# Patient Record
Sex: Female | Born: 1990 | Race: White | Hispanic: No | Marital: Single | Smoking: Former smoker
Health system: Southern US, Community
[De-identification: ages and names within clinical notes are randomized; demographics above are authoritative.]

## PROBLEM LIST (undated history)

## (undated) DIAGNOSIS — J45909 Unspecified asthma, uncomplicated: Secondary | ICD-10-CM

## (undated) DIAGNOSIS — N809 Endometriosis, unspecified: Secondary | ICD-10-CM

## (undated) DIAGNOSIS — A0472 Enterocolitis due to Clostridium difficile, not specified as recurrent: Secondary | ICD-10-CM

## (undated) HISTORY — DX: Endometriosis, unspecified: N80.9

## (undated) HISTORY — DX: Enterocolitis due to Clostridium difficile, not specified as recurrent: A04.72

## (undated) HISTORY — PX: COLPOSCOPY W/ BIOPSY / CURETTAGE: SUR283

## (undated) HISTORY — DX: Unspecified asthma, uncomplicated: J45.909

---
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
## 2012-03-30 HISTORY — PX: PELVIC LAPAROSCOPY: SHX162

## 2012-04-10 ENCOUNTER — Emergency Department: Payer: Self-pay | Admitting: Emergency Medicine

## 2012-04-10 LAB — COMPREHENSIVE METABOLIC PANEL
Anion Gap: 8 (ref 7–16)
BUN: 9 mg/dL (ref 7–18)
Chloride: 106 mmol/L (ref 98–107)
Co2: 24 mmol/L (ref 21–32)
Creatinine: 0.72 mg/dL (ref 0.60–1.30)
EGFR (Non-African Amer.): >60
Osmolality: 275 (ref 275–301)
SGPT (ALT): 18 U/L (ref 12–78)
Sodium: 138 mmol/L (ref 136–145)

## 2012-04-10 LAB — CBC
HCT: 39.6 % (ref 35.0–47.0)
MCHC: 33 g/dL (ref 32.0–36.0)
MCV: 89 fL (ref 80–100)
Platelet: 354 10*3/uL (ref 150–440)

## 2012-04-10 LAB — URINALYSIS, COMPLETE
Specific Gravity: 1.017 (ref 1.003–1.030)
Squamous Epithelial: 2

## 2012-04-12 ENCOUNTER — Other Ambulatory Visit: Payer: Self-pay | Admitting: Internal Medicine

## 2012-04-12 DIAGNOSIS — R1031 Right lower quadrant pain: Secondary | ICD-10-CM

## 2012-04-15 ENCOUNTER — Other Ambulatory Visit: Payer: Self-pay

## 2012-05-30 ENCOUNTER — Ambulatory Visit: Payer: Self-pay | Admitting: Obstetrics and Gynecology

## 2012-06-10 ENCOUNTER — Ambulatory Visit: Payer: Self-pay | Admitting: Obstetrics and Gynecology

## 2012-09-12 ENCOUNTER — Ambulatory Visit: Payer: Self-pay | Admitting: Internal Medicine

## 2012-09-21 ENCOUNTER — Encounter (INDEPENDENT_AMBULATORY_CARE_PROVIDER_SITE_OTHER): Payer: Self-pay | Admitting: Surgery

## 2012-09-28 ENCOUNTER — Encounter (INDEPENDENT_AMBULATORY_CARE_PROVIDER_SITE_OTHER): Payer: Self-pay | Admitting: Surgery

## 2012-09-28 ENCOUNTER — Other Ambulatory Visit (INDEPENDENT_AMBULATORY_CARE_PROVIDER_SITE_OTHER): Payer: Self-pay | Admitting: Surgery

## 2012-09-28 ENCOUNTER — Ambulatory Visit (INDEPENDENT_AMBULATORY_CARE_PROVIDER_SITE_OTHER): Payer: BC Managed Care – PPO | Admitting: Surgery

## 2012-09-28 VITALS — BP 112/58 | HR 80 | Resp 14 | Ht 62.0 in | Wt 104.2 lb

## 2012-09-28 DIAGNOSIS — N644 Mastodynia: Secondary | ICD-10-CM

## 2012-09-28 DIAGNOSIS — N6019 Diffuse cystic mastopathy of unspecified breast: Secondary | ICD-10-CM | POA: Insufficient documentation

## 2012-09-28 DIAGNOSIS — N6012 Diffuse cystic mastopathy of left breast: Secondary | ICD-10-CM

## 2012-09-28 NOTE — Progress Notes (Signed)
General Surgery [facility]  Chief Complaint  Patient presents with  . 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 Patient states that she has had pain in the left breast for at least one year. It has become more significant in the past several months. She describes pain in the upper outer quadrant radiating under the left axilla. Patient was seen and student health and referred to [facility] where she underwent a left breast ultrasound and bilateral mammogram. Mammogram showed no evidence of malignancy but due to very dense glandular tissue, the sensitivity is limited. 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 Patient is now referred to surgery for further recommendations for management.  Patient is currently taking birth control pills under the direction of her gynecologist. She has had no prior breast surgery.  There is a history of breast cancer in a maternal great aunt. There is no history of breast disease in the immediate family members.  Past Medical History  Diagnosis Date  . Asthma   . Endometriosis      Current Outpatient Prescriptions  Medication Sig Dispense Refill  . CALCIUM-VITAMIN D PO Take by mouth.      . fexofenadine (ALLEGRA) 60 MG tablet Take 60 mg by mouth daily.      . norethindrone-ethinyl estradiol (MICROGESTIN,JUNEL,LOESTRIN) 1-20 MG-MCG tablet Take 1 tablet by mouth daily.      . Probiotic Product (SOLUBLE FIBER/PROBIOTICS PO) Take by mouth.      . meloxicam (MOBIC) 7.5 MG tablet Take 7.5 mg by mouth daily.       No current facility-administered medications for this visit.     No Known Allergies   Family History  Problem Relation Age of Onset  . Cancer  Mother     skin  . Cancer Maternal Aunt     breast     History   Social History  . Marital Status: Single    Spouse Name: N/A    Number of Children: N/A  . Years of Education: N/A   Social History Main Topics  . Smoking status: Light Tobacco Smoker  . Smokeless tobacco: None  . Alcohol Use: Yes  . Drug Use: No  . Sexually Active: None   Other Topics Concern  . None   Social History Narrative  . None     REVIEW OF SYSTEMS - PERTINENT POSITIVES ONLY: Denies nipple discharge. Pain does not appear to cycle with menstrual cycles.  EXAM: Filed Vitals:   09/28/12 0952  BP: 112/58  Pulse: 80  Resp: 14    HEENT: normocephalic; pupils equal and reactive; sclerae clear; dentition good; mucous membranes moist NECK:  symmetric on extension; no palpable anterior or posterior cervical lymphadenopathy; no supraclavicular masses; no tenderness CHEST: clear to auscultation bilaterally without rales, rhonchi, or wheezes CARDIAC: regular rate and rhythm without significant murmur; peripheral pulses are full BREAST: Right breast shows normal nipple areolar complex; breast parenchyma is quite dense; there is increased density and nodularity in the upper outer quadrant without discrete or dominant mass; there are small soft left nodes palpable in the axilla; left breast shows a normal nipple areolar complex; again there is diffuse nodularity in the breast, increased in the upper-outer quadrant, with mild tenderness; there are soft small  lymph nodes present in the left axilla with mild tenderness EXT:  non-tender without edema; no deformity NEURO: no gross focal deficits; no sign of tremor   LABORATORY RESULTS: See [facility] (CHL-Epic) for most recent results   RADIOLOGY RESULTS: See [facility] (CHL-Epic) for most recent results   IMPRESSION: #1 likely fibrocystic change of the breast, symptomatic #2 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 I provided her with written literature to review. We discussed fibrocystic change and its significance. I explained to her that she was not at increased risk for breast malignancy based on her family history or on her condition of fibrocystic change.  We discussed measures to take to decrease her discomfort. This includes avoiding caffeine, chocolate, and fatty foods. I suggested possibly taking the vitamin E supplement or going to a natural food store for rose hips as a supplement.  She may also want to discuss changing her birth control medication with her gynecologist.  As a precaution, I am going to ask her to return to [facility] for a follow-up ultrasound of the left breast in 4 months. I will see her back in our office following that study for repeat physical examination and further discussion.  Velora Heckler, MD, FACS General & Endocrine Surgery [facility]   Visit Diagnoses: 1. Mastodynia   2. Fibrocystic change of breast, unspecified laterality     Primary Care Physician: No primary provider on file.

## 2012-09-28 NOTE — Patient Instructions (Signed)
Fibrocystic Breast Changes  Fibrocystic breast changes is a non-cancerous(benign) condition that about half of all women have at some time in their life. It is also called benign breast disease and mammary dysplasia. It may also be called fibrocystic breast disease, but it is not really a disease. It is a common condition that occurs when women go through the hormonal changes during their menstrual cycle, between the ages of 20 to 50. Menopausal women do not have this problem, unless they are on hormone therapy. It can affect one or both breasts. This is not a sign that you will later get cancer.  CAUSES   Overgrowth of cells lining the milk ducts, or enlarged lobules in the breast, cause the breast duct to become blocked. The duct then fills up with fluid. This is like a small balloon filled with water. It is called a cyst. Over time, with repeated inflammation there is a tendency to form scar tissue. This scar tissue becomes the fibrous part of fibrocystic disease. The exact cause of this happening is not known, but it may be related to the female hormones, estrogen and progesterone. Heredity (genetics) may also be a factor in some cases.  SYMPTOMS    Tenderness.   Swelling.   Rope-like feeling.   Lumpy breast, one or both sides.   Changes in the size of the breasts, before and after the menstrual period (larger before, smaller after).   Green or dark brown nipple discharge (not blood).  Symptoms are usually worse before periods (menstrual cycle) and get better toward the end of menstruation. Usually, it is temporary minor discomfort. But some women have severe pain.   DIAGNOSIS   Check your breasts monthly. The best time to check your breasts is after your period. If you check them during your period, you are more likely to feel the normal glands enlarged, as a result of the hormonal changes that happen right before your period. If you do not have menstrual periods, check your breasts the first day of every  month. Become familiar with the way your own breasts feel. It is then easier to notice if there are changes, such as more tenderness, a new growth, change in breast size, or a change in a lump that has always been there. All breasts lumps need to be investigated, to rule out breast cancer. See your caregiver as soon as possible, if you find a lump. Most breast lumps are not cancerous. Excellent treatment is available for ones that are.   To make a diagnosis, your caregiver will examine your breasts and may recommend other tests, such as:   Mammogram (breast X-ray).   Ultrasound.   MRI (magnetic resonance imaging).   Removing fluid from the cyst with a fine needle, under local anesthesia (aspiration).   Taking a breast tissue sample (breast biopsy).  Some questions your caregiver will ask are:   What was the date of your last period?   When did the lump show up?   Is there any discharge from your breast?   Is the breast tender or painful?   Are the symptoms in one or both breasts?   Has the lump changed in size from month-to-month? How long has it been present?   Any family history of breast problems?   Any past breast problems?   Any history of breast surgery?   Are you taking any medications?   When was your last mammogram, and where was it done?  TREATMENT      Dietary changes help to prevent or reduce the symptoms of fibrocystic breast changes.   You may need to stop consuming all foods that contain caffeine, such as chocolate, sodas, coffee, and tea.   Reducing sugar and fat in your diet may also help.   Decrease estrogen in your diet. Some sources include commercially raised meats which contain estrogen. Eliminate other natural estrogens.   Birth control pills can also make symptoms worse.   Natural progesterone cream, applied at a dose of 15 to 20 milligrams per day, from ovulation until a day or two before your period returns, may help with returning to normal breast tissue over several  months. Seek advice from your caregiver.   Over-the-counter pain pills may help, as recommended by your caregiver.   Danazol hormone (female-like hormone) is sometimes used. It may cause hair growth and acne.   Needle aspiration can be used, to remove fluid from the cyst.   Surgery may be needed, to remove a large, persistent, and tender cyst.   Evening primrose oil may help with the tenderness and pain. It has linolenic acid that women may not have enough of.  HOME CARE INSTRUCTIONS    Examine your breasts after every menstrual period.   If you do not have menstrual periods, examine your breasts the first day of every month.   Wear a firm support bra, especially when exercising.   Decrease or avoid caffeine in your diet.   Decrease the fat and sugar in your diet.   Eat a balanced diet.   Try to see your caregiver after you have a menstrual period.   Before seeing your caregiver, make notes about:   When you have the symptoms.   What types of symptoms you are having.   Medications you are taking.   When and where your last mammogram was taken.   Past breast problems or breast surgery.  SEEK MEDICAL CARE IF:    You have been diagnosed with fibrocystic breast changes, and you develop changes in your breast:   Discharge from the nipple, especially bloody discharge.   Pain in the breast that does not go away after your menstrual period.   New lumps or bumps in the breast.   Lumps in your armpit.   Your breast or breasts become enlarged, red, and painful.   You find an isolated lump, even if it is not tender.   You have questions about this condition that have not been answered.  Document Released: 12/31/2005 Document Revised: 06/08/2011 Document Reviewed: 03/27/2009  [employer] Patient Information 2014 [employer].

## 2012-10-04 ENCOUNTER — Encounter: Payer: Self-pay | Admitting: General Surgery

## 2012-10-04 ENCOUNTER — Ambulatory Visit (INDEPENDENT_AMBULATORY_CARE_PROVIDER_SITE_OTHER): Payer: BC Managed Care – PPO | Admitting: General Surgery

## 2012-10-04 ENCOUNTER — Other Ambulatory Visit: Payer: Self-pay

## 2012-10-04 VITALS — BP 120/64 | HR 68 | Resp 12 | Ht 62.0 in | Wt 103.0 lb

## 2012-10-04 DIAGNOSIS — N644 Mastodynia: Secondary | ICD-10-CM

## 2012-10-04 DIAGNOSIS — N6009 Solitary cyst of unspecified breast: Secondary | ICD-10-CM | POA: Insufficient documentation

## 2012-10-04 DIAGNOSIS — N6002 Solitary cyst of left breast: Secondary | ICD-10-CM

## 2012-10-04 NOTE — Patient Instructions (Signed)
The patient is aware to use an antiinflammatory of choice (Advil or Aleve) as needed for comfort.

## 2012-10-04 NOTE — Progress Notes (Signed)
Patient ID: Brenda Knight, female   DOB: March 31, 1990, 22 y.o.   MRN: [id number]  Chief Complaint  Patient presents with  . Breast Problem    2nd opinion left breast mass    HPI Brenda Knight is a 22 y.o. female here today for an left breast mass . Had an ultrasound at [facility] on 09/22/12. Patient noticed it about three months ago, she states she has had pain in the area  for about two years. The patient is accompanied today by her mother who was present for the interview and exam. The patient has experienced discomfort in the upper outer quadrant of the left breast for about 2 years, worse over the last 6-12 months. She is appreciating daily discomfort, not aggravated by menses or physical activity.  She was evaluated in [place] earlier this year and reassured that no pathologic process was present. As she's had persistent pain she is seen today for a second opinion.  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 HPI  Past Medical History  Diagnosis Date  . Asthma   . Endometriosis   . C. difficile colitis 2008    Past Surgical History  Procedure Laterality Date  . Pelvic laparoscopy  2014    endometriosis    Family History  Problem Relation Age of Onset  . Cancer Mother     skin  . Cancer Maternal Aunt     breast    Social History History  Substance Use Topics  . Smoking status: Former Smoker -- 1 years  . Smokeless tobacco: Not on file  . Alcohol Use: Yes    Allergies  Allergen Reactions  . Sulfa Antibiotics Shortness Of Breath and Other (See Comments)    "Skin turns red" per patient    Current Outpatient Prescriptions  Medication Sig Dispense Refill  . CALCIUM-VITAMIN D PO Take by mouth.      . fexofenadine (ALLEGRA) 60 MG tablet Take 60 mg by mouth daily.      Colleen Can FE 1/20 1-20 MG-MCG tablet Take 1 tablet by mouth daily.      . norethindrone-ethinyl estradiol (MICROGESTIN,JUNEL,LOESTRIN) 1-20 MG-MCG tablet Take 1 tablet by mouth daily.       . Probiotic Product (SOLUBLE FIBER/PROBIOTICS PO) Take by mouth.       No current facility-administered medications for this visit.    Review of Systems Review of Systems  Constitutional: Negative.   Respiratory: Negative.   Cardiovascular: Negative.     Blood pressure 120/64, pulse 68, resp. rate 12, height 5\' 2"  (1.575 m), weight 103 lb (46.72 kg).  Physical Exam Physical Exam  Constitutional: She is oriented to person, place, and time. She appears well-developed and well-nourished.  Cardiovascular: Normal rate, regular rhythm and normal heart sounds.   Pulmonary/Chest: Breath sounds normal. Right breast exhibits no inverted nipple, no mass, no nipple discharge, no skin change and no tenderness. Left breast exhibits no inverted nipple, no mass, no nipple discharge, no skin change and no tenderness.  Lymphadenopathy:    She has no cervical adenopathy.  Tiny right axillary lymph node   Neurological: She is alert and oriented to person, place, and time.  Skin: Skin is warm and dry.    Data Reviewed Bilateral mammogram dated September 12, 2012 she extremely dense breasts. No discernible lesion is identified. BI-RAD-2.  Focused ultrasound examination of the left breast on the same date showed a mildly prominent lymph node of the left axilla region of the site  of the patient's pain. Elliptical low echotexture solid-appearing smoothly marginated nodules noted at 12 and 1:30 o'clock position. The axillary lymph node measured at 1.44 cm in size.  Ultrasound examination of the area of focal tenderness near the periphery of the breast at the 1:00 position in the axillary tail showed a 1 cm trauma and lymph node corresponding area of palpable tenderness. At the 12:00 position 6 cm in the nipple a 0.5 x 0.6 cm simple cyst is identified. A smaller lesion is noted in the peri-areolar area.  Assessment    llong-standing mastalgia, centered on a mildly prominent left axillary node.      Plan     Options for management were reviewed with the patient and her mother. Office notes from Dr. Cathrine Muster dated September 28, 2012 werereviewed.  The area of maximum tenderness is overlying the axillary lymph node. Resection can be completed to #1) confirm the clinical diagnosis of a hyperplastic lymph node and remove any concern for breast cancer/lymphoma which is a high level of concern for the mother and #2) possibly relieve the focal pain the patient is experiencing. It was emphasized to both patient and mother that excision may not relieve the breast pain she is experiencing.    The risks associated with a vacuum biopsy of the area including bleeding, infection and persistent pain were discussed.        Earline Mayotte 10/04/2012, 8:17 PM

## 2012-10-10 ENCOUNTER — Encounter: Payer: Self-pay | Admitting: General Surgery

## 2012-10-10 ENCOUNTER — Ambulatory Visit (INDEPENDENT_AMBULATORY_CARE_PROVIDER_SITE_OTHER): Payer: BC Managed Care – PPO | Admitting: General Surgery

## 2012-10-10 VITALS — BP 108/64 | HR 80 | Resp 12 | Ht 62.0 in | Wt 107.0 lb

## 2012-10-10 DIAGNOSIS — N6012 Diffuse cystic mastopathy of left breast: Secondary | ICD-10-CM

## 2012-10-10 DIAGNOSIS — N63 Unspecified lump in unspecified breast: Secondary | ICD-10-CM

## 2012-10-10 DIAGNOSIS — N644 Mastodynia: Secondary | ICD-10-CM

## 2012-10-10 DIAGNOSIS — N6019 Diffuse cystic mastopathy of unspecified breast: Secondary | ICD-10-CM

## 2012-10-10 NOTE — Patient Instructions (Signed)

## 2012-10-10 NOTE — Progress Notes (Signed)
aPatient ID: Brenda Knight, female   DOB: February 05, 1991, 22 y.o.   MRN: [id number]  Chief Complaint  Patient presents with  . Procedure    left axillary biopsy    HPI Brenda Knight is a 22 y.o. female here today for an left axillary biopsy. T. Health records from [facility] Were reviewed, and there had been a recommendation for a colposcopy based on an abnormal Pap smear. The patient is presently under the care of Vena Austria, M.D. At [facility] in this regard. HPI  Past Medical History  Diagnosis Date  . Asthma   . Endometriosis   . C. difficile colitis 2008    Past Surgical History  Procedure Laterality Date  . Pelvic laparoscopy  2014    endometriosis    Family History  Problem Relation Age of Onset  . Cancer Mother     skin  . Cancer Maternal Aunt     breast    Social History History  Substance Use Topics  . Smoking status: Former Smoker -- 1 years  . Smokeless tobacco: Not on file  . Alcohol Use: Yes    Allergies  Allergen Reactions  . Sulfa Antibiotics Shortness Of Breath and Other (See Comments)    "Skin turns red" per patient    Current Outpatient Prescriptions  Medication Sig Dispense Refill  . CALCIUM-VITAMIN D PO Take by mouth.      . fexofenadine (ALLEGRA) 60 MG tablet Take 60 mg by mouth daily.      Colleen Can FE 1/20 1-20 MG-MCG tablet Take 1 tablet by mouth daily.      . norethindrone-ethinyl estradiol (MICROGESTIN,JUNEL,LOESTRIN) 1-20 MG-MCG tablet Take 1 tablet by mouth daily.      . Probiotic Product (SOLUBLE FIBER/PROBIOTICS PO) Take by mouth.       No current facility-administered medications for this visit.    Review of Systems Review of Systems  Constitutional: Negative.   Respiratory: Negative.   Cardiovascular: Negative.     Blood pressure 108/64, pulse 80, resp. rate 12, height 5\' 2"  (1.575 m), weight 107 lb (48.535 kg).  Physical Exam Physical Exam Mild prominence in the axillary tail/lower axilla and the left is again  appreciated. Data Reviewed Previous ultrasound reviewed. Ultrasound examination showed a 0.5 x 0.55 x 0.9 cm hypoechoic area with some focal shadowing near the tail of the axilla. Assessment    Prominent axillary tail/possible lymphadenopathy left axilla.     Plan    The patient was amenable to proceed to biopsy. She was accompanied today by her mother who was present for the procedure.  The area was prepped with all of 10 cc of 0.5% Xylocaine with 0.25% Marcaine with one 200,000 units of epinephrine was utilized a well-tolerated. The area was then prepped with ChloraPrep draped. A 10-gauge Encor device was advanced under ultrasound guidance. A core samples were obtained. Mild discomfort in the most superficial samples was noted. This had resolved completely at the end of the procedure. Scant bleeding. Wound defect was closed with benzoin and Steri-Strips followed by Telfa and Tegaderm dressing. Written instructions were provided. The patient will return for nursing check in one week. She'll contact when results are available.        Earline Mayotte 10/11/2012, 9:21 PM

## 2012-10-11 ENCOUNTER — Telehealth: Payer: Self-pay

## 2012-10-11 ENCOUNTER — Encounter: Payer: Self-pay | Admitting: General Surgery

## 2012-10-11 NOTE — Telephone Encounter (Signed)
Message copied by Sinda Du on Tue Oct 11, 2012 12:29 PM ------      Message from: [place], Merrily Pew      Created: Sun Oct 09, 2012 10:13 AM       Contact patient and see if she had a colposcopy in fall 2013 or this year to follow up an abnormal pap smear. Thanks.  ------

## 2012-10-11 NOTE — Telephone Encounter (Signed)
Patient called back to report that she has not had a colposcopy to follow up from her abnormal pap. This patient states that Dr. Bonney Aid at [facility] did not make mention that she needed to have this done.

## 2012-10-11 NOTE — Telephone Encounter (Signed)
Message left on mobile voicemail to call back.

## 2012-10-12 ENCOUNTER — Telehealth: Payer: Self-pay

## 2012-10-12 HISTORY — PX: BREAST BIOPSY: SHX20

## 2012-10-12 LAB — PATHOLOGY

## 2012-10-12 NOTE — Telephone Encounter (Signed)
Spoke with patient and let her know that her pathology was benign. Patient is very pleased and will follow up with the nurse here as scheduled.

## 2012-10-18 ENCOUNTER — Encounter (INDEPENDENT_AMBULATORY_CARE_PROVIDER_SITE_OTHER): Payer: Self-pay

## 2012-10-18 ENCOUNTER — Ambulatory Visit: Payer: BC Managed Care – PPO

## 2012-10-21 ENCOUNTER — Encounter: Payer: Self-pay | Admitting: General Surgery

## 2012-11-09 ENCOUNTER — Ambulatory Visit: Payer: BC Managed Care – PPO | Admitting: General Surgery

## 2012-11-30 ENCOUNTER — Ambulatory Visit: Payer: BC Managed Care – PPO | Admitting: General Surgery

## 2012-12-13 ENCOUNTER — Encounter: Payer: Self-pay | Admitting: General Surgery

## 2012-12-13 ENCOUNTER — Ambulatory Visit (INDEPENDENT_AMBULATORY_CARE_PROVIDER_SITE_OTHER): Payer: BC Managed Care – PPO | Admitting: General Surgery

## 2012-12-13 VITALS — BP 94/58 | HR 84 | Resp 12 | Ht 62.0 in | Wt 101.0 lb

## 2012-12-13 DIAGNOSIS — N63 Unspecified lump in unspecified breast: Secondary | ICD-10-CM

## 2012-12-13 NOTE — Patient Instructions (Addendum)
Continue self breast exams. Call office for any new breast issues or concerns. 

## 2012-12-13 NOTE — Progress Notes (Signed)
Patient ID: Brenda Knight, female   DOB: [birth year], 22 y.o.   MRN: [id number]  Chief Complaint  Patient presents with  . Follow-up    HPI Brenda Knight is a 22 y.o. female.  Here today for her follow up from a left breast biopsy done 10-12-12.  No new breast issues. The patient reports that the tenderness she previously experienced in the axillary tail of the left breast has resolved postbiopsy.  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 HPI  Past Medical History  Diagnosis Date  . Asthma   . Endometriosis   . C. difficile colitis 2008    Past Surgical History  Procedure Laterality Date  . Pelvic laparoscopy  2014    endometriosis  . Breast biopsy Left 10-12-12    FIBROADENOMA    Family History  Problem Relation Age of Onset  . Cancer Mother     skin  . Cancer Maternal Aunt     breast    Social History History  Substance Use Topics  . Smoking status: Former Smoker -- 1 years  . Smokeless tobacco: Not on file  . Alcohol Use: Yes    Allergies  Allergen Reactions  . Sulfa Antibiotics Shortness Of Breath and Other (See Comments)    "Skin turns red" per patient    Current Outpatient Prescriptions  Medication Sig Dispense Refill  . CALCIUM-VITAMIN D PO Take by mouth.      . fexofenadine (ALLEGRA) 60 MG tablet Take 60 mg by mouth daily.      Colleen Can FE 1/20 1-20 MG-MCG tablet Take 1 tablet by mouth daily.      . norethindrone-ethinyl estradiol (MICROGESTIN,JUNEL,LOESTRIN) 1-20 MG-MCG tablet Take 1 tablet by mouth daily.      . Probiotic Product (SOLUBLE FIBER/PROBIOTICS PO) Take by mouth.       No current facility-administered medications for this visit.    Review of Systems Review of Systems  Constitutional: Negative.   Respiratory: Negative.   Cardiovascular: Negative.     Blood pressure 94/58, pulse 84, resp. rate 12, height 5\' 2"  (1.575 m), weight 101 lb (45.813 kg).  Physical Exam Physical Exam  Constitutional: She is oriented to  person, place, and time. She appears well-developed and well-nourished.  Pulmonary/Chest: Right breast exhibits no inverted nipple, no mass, no nipple discharge, no skin change and no tenderness. Left breast exhibits no inverted nipple, no nipple discharge, no skin change and no tenderness. Mass: 1 cm nodule l focal thickening in the axillary tail of the left breast. Nontender. Area of previous biopsy.  Well healed scar left breast  Lymphadenopathy:    She has no axillary adenopathy.  Neurological: She is alert and oriented to person, place, and time.  Skin: Skin is warm and dry.    Data Reviewed Biopsy showed evidence of a fibroadenoma.  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    Plan    Monthly breast self-examination has been encouraged. Should the patient develop recurrent discomfort were appreciated enlargement she has been encouraged to call and we'll discuss excision.       Earline Mayotte 12/13/2012, 9:25 PM

## 2013-01-13 ENCOUNTER — Encounter (INDEPENDENT_AMBULATORY_CARE_PROVIDER_SITE_OTHER): Payer: Self-pay | Admitting: Surgery

## 2013-02-02 ENCOUNTER — Other Ambulatory Visit: Payer: Self-pay

## 2013-10-17 ENCOUNTER — Ambulatory Visit: Payer: Self-pay | Admitting: General Surgery

## 2013-11-21 ENCOUNTER — Encounter: Payer: Self-pay | Admitting: *Deleted

## 2014-01-29 ENCOUNTER — Encounter: Payer: Self-pay | Admitting: General Surgery

## 2014-02-27 ENCOUNTER — Emergency Department (HOSPITAL_COMMUNITY)
Admission: EM | Admit: 2014-02-27 | Discharge: 2014-02-27 | Disposition: A | Discharge status: Home / Self Care | Payer: Self-pay | Attending: Emergency Medicine | Admitting: Emergency Medicine

## 2014-02-27 ENCOUNTER — Encounter (HOSPITAL_COMMUNITY): Payer: Self-pay

## 2014-02-27 DIAGNOSIS — Y92009 Unspecified place in unspecified non-institutional (private) residence as the place of occurrence of the external cause: Secondary | ICD-10-CM | POA: Insufficient documentation

## 2014-02-27 DIAGNOSIS — Z79899 Other long term (current) drug therapy: Secondary | ICD-10-CM | POA: Insufficient documentation

## 2014-02-27 DIAGNOSIS — Y9389 Activity, other specified: Secondary | ICD-10-CM | POA: Insufficient documentation

## 2014-02-27 DIAGNOSIS — Z8742 Personal history of other diseases of the female genital tract: Secondary | ICD-10-CM | POA: Insufficient documentation

## 2014-02-27 DIAGNOSIS — Z8619 Personal history of other infectious and parasitic diseases: Secondary | ICD-10-CM | POA: Insufficient documentation

## 2014-02-27 DIAGNOSIS — Z720 Tobacco use: Secondary | ICD-10-CM | POA: Insufficient documentation

## 2014-02-27 DIAGNOSIS — Y288XXA Contact with other sharp object, undetermined intent, initial encounter: Secondary | ICD-10-CM | POA: Insufficient documentation

## 2014-02-27 DIAGNOSIS — J45909 Unspecified asthma, uncomplicated: Secondary | ICD-10-CM | POA: Insufficient documentation

## 2014-02-27 DIAGNOSIS — Y998 Other external cause status: Secondary | ICD-10-CM | POA: Insufficient documentation

## 2014-02-27 DIAGNOSIS — S61412A Laceration without foreign body of left hand, initial encounter: Secondary | ICD-10-CM | POA: Insufficient documentation

## 2014-02-27 MED ORDER — IBUPROFEN 600 MG PO TABS: 600.0000 mg | ORAL_TABLET | Freq: Four times a day (QID) | ORAL | Status: DC | PRN

## 2014-02-27 MED ORDER — BACITRACIN 500 UNIT/GM EX OINT
1.0000 "application " | TOPICAL_OINTMENT | Freq: Two times a day (BID) | CUTANEOUS | Status: DC
  Administered 2014-02-27: 1 via TOPICAL

## 2014-02-27 NOTE — Discharge Instructions (Signed)
Please follow up with your primary care physician in 1-2 days. If you do not have one please call the [facility] number listed above. Please alternate between Motrin and Tylenol every three hours for pain. Please keep the area clean dry and covered. Please read all discharge instructions and return precautions.   Non-Sutured Laceration A laceration is a cut or wound that goes through all layers of the skin and into the tissue just beneath the skin. Usually, these are stitched up or held together with tape or glue shortly after the injury occurred. However, if several or more hours have passed before getting care, too many germs (bacteria) get into the laceration. Stitching it closed would bring the risk of infection. If your health care provider feels your laceration is too old, it may be left open and then bandaged to allow healing from the bottom layer up. HOME CARE INSTRUCTIONS   Change the bandage (dressing) 2 times a day or as directed by your health care provider.  If the dressing or packing gauze sticks, soak it off with soapy water.  When you re-bandage your laceration, make sure that the dressing or packing gauze goes all the way to the bottom of the laceration. The top of the laceration is kept open so it can heal from the bottom up. There is less chance for infection with this method.  Wash the area with soap and water 2 times a day to remove all the creams or ointments, if used. Rinse off the soap. Pat the area dry with a clean towel. Look for signs of infection, such as redness, swelling, or a red line that goes away from the laceration.  Re-apply creams or ointments if they were used to bandage the laceration. This helps keep the bandage from sticking.  If the bandage becomes wet, dirty, or has a bad smell, change it as soon as possible.  Only take medicine as directed by your health care provider. You might need a tetanus shot now if:  You have no idea when  you had the last one.  You have never had a tetanus shot before.  Your laceration had dirt in it.  Your laceration was dirty, and your last tetanus shot was more than 7 years ago.  Your laceration was clean, and your last tetanus shot was more than 10 years ago. If you need a tetanus shot, and you decide not to get one, there is a rare chance of getting tetanus. Sickness from tetanus can be serious. If you got a tetanus shot, your arm may swell and get red and warm to the touch at the shot site. This is common and not a problem. SEEK MEDICAL CARE IF:   You have redness, swelling, or increasing pain in the laceration.  You notice a red line that goes away from your laceration.  You have pus coming from the laceration.  You have a fever.  You notice a bad smell coming from the laceration or dressing.  You notice something coming out of the laceration, such as wood or glass.  Your laceration is on your hand or foot and you are unable to properly move a finger or toe.  You have severe swelling around the laceration, causing pain and numbness.  You notice a change in color in your arm, hand, leg, or foot. MAKE SURE YOU:   Understand these instructions.  Will watch your condition.  Will get help right away if you are not doing  well or get worse. Document Released: 02/11/2006 Document Revised: 03/21/2013 Document Reviewed: 09/03/2008 [facility] Patient Information 2015 [place]. This information is not intended to replace advice given to you by your health care provider. Make sure you discuss any questions you have with your health care provider.

## 2014-02-27 NOTE — ED Notes (Signed)
Pt from home with laceration to left hand; pt cut hand with glass jar. Pt sts injury occurred about 6 pm last night.  Sts she put liquid stitches on it.  Last tetanus 2009.

## 2014-02-27 NOTE — ED Provider Notes (Signed)
CSN: [id number]     Arrival date & time 02/27/14  1837 History  This chart was scribed for non-physician practitioner, Francee PiccoloJennifer Franny Selvage, PA-C working with Gerhard Munchobert Lockwood, MD by Greggory StallionKayla Andersen, ED scribe. This patient was seen in room TR04C/TR04C and the patient's care was started at 7:39 PM.   Chief Complaint  Patient presents with  . Hand Injury   The history is provided by the patient. No language interpreter was used.    HPI Comments: Brenda Knight is a 23 y.o. female who presents to the Emergency Department complaining of a laceration to her left hand that occurred at 6 PM yesterday. Pt accidentally cut her hand on a glass jar. States she put liquid sutures on the wound. Reports pain around the area. She has taken ibuprofen with some relief. Pt's last tetanus was in 2009. She is right hand dominant. Denies previous hand injuries.  Past Medical History  Diagnosis Date  . Asthma   . Endometriosis   . C. difficile colitis 2008   Past Surgical History  Procedure Laterality Date  . Pelvic laparoscopy  2014    endometriosis  . Breast biopsy Left 10-12-12    FIBROADENOMA   Family History  Problem Relation Age of Onset  . Cancer Mother     skin  . Cancer Maternal Aunt     breast   History  Substance Use Topics  . Smoking status: Current Some Day Smoker -- 1 years  . Smokeless tobacco: Not on file  . Alcohol Use: Yes   OB History    Obstetric Comments   1st Menstrual Cycle: 13 1st Pregnancy: NA     Review of Systems  Musculoskeletal: Positive for arthralgias.  Skin: Positive for wound.  All other systems reviewed and are negative.  Allergies  Sulfa antibiotics  Home Medications   Prior to Admission medications   Medication Sig Start Date End Date Taking? Authorizing Provider  CALCIUM-VITAMIN D PO Take by mouth.    Historical Provider, MD  fexofenadine (ALLEGRA) 60 MG tablet Take 60 mg by mouth daily.    Historical Provider, MD  ibuprofen (ADVIL,MOTRIN) 600 MG  tablet Take 1 tablet (600 mg total) by mouth every 6 (six) hours as needed. 02/27/14   Jermiyah Ricotta L Alysia Scism, PA-C  JUNEL FE 1/20 1-20 MG-MCG tablet Take 1 tablet by mouth daily. 09/23/12   Historical Provider, MD  norethindrone-ethinyl estradiol (MICROGESTIN,JUNEL,LOESTRIN) 1-20 MG-MCG tablet Take 1 tablet by mouth daily.    Historical Provider, MD  Probiotic Product (SOLUBLE FIBER/PROBIOTICS PO) Take by mouth.    Historical Provider, MD   BP 116/65 mmHg  Pulse 78  Temp(Src) 96.9 F (36.1 C) (Oral)  Resp 16  Ht 5\' 2"  (1.575 m)  Wt 96 lb (43.545 kg)  BMI 17.55 kg/m2  SpO2 100%  LMP 02/19/2014   Physical Exam  Constitutional: She is oriented to person, place, and time. She appears well-developed and well-nourished. No distress.  HENT:  Head: Normocephalic and atraumatic.  Right Ear: External ear normal.  Left Ear: External ear normal.  Nose: Nose normal.  Mouth/Throat: Oropharynx is clear and moist.  Eyes: Conjunctivae are normal.  Neck: Normal range of motion. Neck supple.  Cardiovascular: Normal rate, regular rhythm, normal heart sounds and intact distal pulses.   Pulmonary/Chest: Effort normal and breath sounds normal. No respiratory distress.  Abdominal: Soft.  Musculoskeletal: Normal range of motion.  Neurological: She is alert and oriented to person, place, and time.  Skin: Skin is warm and dry.  She is not diaphoretic.  Palmar surface of left hand with a 1 cm V-shaped superficial laceration at first MCP joint. No foreign body visualized.   Psychiatric: She has a normal mood and affect.  Nursing note and vitals reviewed.   ED Course  Procedures (including critical care time) Medications  bacitracin ointment 1 application (1 application Topical Given 02/27/14 2004)    DIAGNOSTIC STUDIES: Oxygen Saturation is 100% on RA, normal by my interpretation.    COORDINATION OF CARE: 7:40 PM-Discussed treatment plan which includes cleaning wound with pt at bedside and pt agreed  to plan.   Labs Review Labs Reviewed - No data to display  Imaging Review No results found.   EKG Interpretation None      MDM   Final diagnoses:  Hand laceration, left, initial encounter    Filed Vitals:   02/27/14 1917  BP: 116/65  Pulse: 78  Temp: 96.9 F (36.1 C)  Resp: 16   Afebrile, NAD, non-toxic appearing, AAOx4.  Neurovascularly intact. Normal sensation. No evidence of compartment syndrome. Patient with laceration occurring > 24 hours ago. No evidence of infection. Superficial wound. No FB appreciated. Discussed that wound could not be sutured closed at this time due to increased risk of infection. Patient is aware and understands. Wound will be cleansed and dressed. Return precautions discussed. Patient is agreeable to plan.  Patient is stable at time of discharge   I personally performed the services described in this documentation, which was scribed in my presence. The recorded information has been reviewed and is accurate.  Jeannetta EllisJennifer L Shaquayla Klimas, PA-C 02/27/14 2020  Gerhard Munchobert Lockwood, MD 02/28/14 [phone]

## 2014-07-04 ENCOUNTER — Emergency Department
Admit: 2014-07-04 | Disposition: A | Discharge status: Home / Self Care | Payer: Self-pay | Admitting: Emergency Medicine

## 2014-07-05 LAB — CBC
HCT: 40.7 % (ref 35.0–47.0)
HGB: 13.2 g/dL (ref 12.0–16.0)
MCH: 28.5 pg (ref 26.0–34.0)
MCHC: 32.4 g/dL (ref 32.0–36.0)
MCV: 88 fL (ref 80–100)
PLATELETS: 304 10*3/uL (ref 150–440)
RBC: 4.61 10*6/uL (ref 3.80–5.20)
RDW: 12.2 % (ref 11.5–14.5)
WBC: 9.2 10*3/uL (ref 3.6–11.0)

## 2014-07-05 LAB — URINALYSIS, COMPLETE
BACTERIA: NONE SEEN
BILIRUBIN, UR: NEGATIVE
BLOOD: NEGATIVE
Glucose,UR: NEGATIVE mg/dL (ref 0–75)
Ketone: NEGATIVE
LEUKOCYTE ESTERASE: NEGATIVE
NITRITE: NEGATIVE
PH: 6 (ref 4.5–8.0)
Protein: NEGATIVE
RBC,UR: NONE SEEN /HPF (ref 0–5)
SPECIFIC GRAVITY: 1.015 (ref 1.003–1.030)
WBC UR: 4 /HPF (ref 0–5)

## 2014-07-05 LAB — BASIC METABOLIC PANEL
ANION GAP: 7 (ref 7–16)
BUN: 12 mg/dL
CALCIUM: 9.1 mg/dL
CHLORIDE: 103 mmol/L
CO2: 28 mmol/L
Creatinine: 0.67 mg/dL
EGFR (Non-African Amer.): >60
GLUCOSE: 92 mg/dL
POTASSIUM: 3.8 mmol/L
Sodium: 138 mmol/L

## 2014-07-05 LAB — WET PREP, GENITAL

## 2014-07-06 LAB — GC/CHLAMYDIA PROBE AMP

## 2014-07-20 NOTE — Op Note (Signed)
PATIENT NAME:  Brenda Knight, Brenda Knight MR#:  [id number] DATE OF BIRTH:  [birth year]  DATE OF PROCEDURE:  06/10/2012  PREOPERATIVE DIAGNOSES:  Chronic pelvic pain.   POSTOPERATIVE DIAGNOSES:  Chronic pelvic pain.  OPERATION PERFORMED:  Diagnostic laparoscopy.   ANESTHESIA USED:  General.  SURGEON: Florina OuAndreas M. Bonney AidStaebler, MD   ESTIMATED BLOOD LOSS:  Minimal.   COMPLICATIONS:  None.   INTRAOPERATIVE FINDINGS:  Normal uterus, tubes and ovaries. Normal in caliber ureters bilaterally with normal-appearing appendix, normal liver edge. No intra-abdominal adhesions. A small peritoneal window in the posterior cul-de-sac with reddish tissue, which was biopsied. Otherwise, no suspicious lesions to suggest endometriosis as the cause of the patient's pelvic pain.   SPECIMENS REMOVED:  Peritoneal biopsies.   POSTOPERATIVE CONDITION:  Stable.   PROCEDURE IN DETAIL:  Risks, benefits and alternatives of the procedure were discussed with the patient prior to proceeding to the operating room.  She was taken to the operating room where general anesthesia was administered. The patient was positioned in the dorsal lithotomy position using Allen stirrups, prepped and draped in the usual sterile fashion. Timeout was performed. The patient's bladder was straight cathed prior to beginning of the case. A sterile speculum was then placed. The anterior lip of the cervix was visualized, grasped with a single-tooth tenaculum and a Hulka tenaculum was then placed before removing the single-tooth tenaculum and sterile speculum. Attention was then turned to the patient's abdomen. The umbilicus was infiltrated with 5 mL of 0.5% Sensorcaine. A stab incision was then made at the base of the umbilicus. A 5 mm XL trocar was used to gain entry into the peritoneal cavity under direct visualization. Pneumoperitoneum was then established, and a 5 mm left assistant port was placed under direct visualization. The diagnostic laparoscopy revealed the  above findings. The 5 mm assistant port was used to introduce a small biopsy forceps, which was used to biopsy the peritoneal window. The biopsy site was hemostatic following the procedure. Pneumoperitoneum was evacuated, and the port sites were removed under direct visualization. Each trocar site was then dressed with Dermabond. Sponge, needle and instrument counts were correct x2. The Hulka tenaculum was removed. The patient was taken to the recovery room in stable condition.    ____________________________ Florina OuAndreas M. Bonney AidStaebler, MD ams:dmm D: 06/13/2012 12:33:56 ET T: 06/13/2012 13:00:55 ET JOB#: [id number]  cc: Florina OuAndreas M. Bonney AidStaebler, MD, <Dictator> Carmel SacramentoANDREAS Cathrine MusterM Kollyns Mickelson MD ELECTRONICALLY SIGNED 06/17/2012 9:27

## 2014-10-22 ENCOUNTER — Encounter (HOSPITAL_COMMUNITY): Payer: Self-pay | Admitting: Emergency Medicine

## 2014-10-22 ENCOUNTER — Emergency Department (HOSPITAL_COMMUNITY)
Admission: EM | Admit: 2014-10-22 | Discharge: 2014-10-22 | Disposition: A | Discharge status: Home / Self Care | Payer: Self-pay | Source: Home / Self Care | Attending: Family Medicine | Admitting: Family Medicine

## 2014-10-22 DIAGNOSIS — L299 Pruritus, unspecified: Secondary | ICD-10-CM

## 2014-10-22 MED ORDER — METHYLPREDNISOLONE ACETATE 80 MG/ML IJ SUSP
80.0000 mg | Freq: Once | INTRAMUSCULAR | Status: AC
  Administered 2014-10-22: 80 mg via INTRAMUSCULAR

## 2014-10-22 MED ORDER — HYDROXYZINE HCL 25 MG PO TABS: 25.0000 mg | ORAL_TABLET | Freq: Four times a day (QID) | ORAL | Status: DC

## 2014-10-22 MED ORDER — METHYLPREDNISOLONE ACETATE 80 MG/ML IJ SUSP
INTRAMUSCULAR | Status: AC
  Filled 2014-10-22: qty 1

## 2014-10-22 NOTE — ED Provider Notes (Signed)
CSN: [id number]     Arrival date & time 10/22/14  1313 History   First MD Initiated Contact with Patient 10/22/14 1421     Chief Complaint  Patient presents with  . Pruritis   (Consider location/radiation/quality/duration/timing/severity/associated sxs/prior Treatment) Patient is a 24 y.o. female presenting with rash. The history is provided by the patient.  Rash Location:  Full body Quality: itchiness   Severity:  Mild Onset quality:  Sudden Duration:  1 day Progression:  Unchanged Chronicity:  New Context: not insect bite/sting and not sick contacts   Context comment:  Has been in hosp with mother for 4 days, under stress. Relieved by:  Antihistamines Associated symptoms: no fever     Past Medical History  Diagnosis Date  . Asthma   . Endometriosis   . C. difficile colitis 2008   Past Surgical History  Procedure Laterality Date  . Pelvic laparoscopy  2014    endometriosis  . Breast biopsy Left 10-12-12    FIBROADENOMA   Family History  Problem Relation Age of Onset  . Cancer Mother     skin  . Cancer Maternal Aunt     breast   History  Substance Use Topics  . Smoking status: Current Some Day Smoker -- 1 years  . Smokeless tobacco: Not on file  . Alcohol Use: Yes   OB History    Obstetric Comments   1st Menstrual Cycle: 13 1st Pregnancy: NA     Review of Systems  Constitutional: Negative.  Negative for fever.  Skin: Positive for rash.    Allergies  Sulfa antibiotics  Home Medications   Prior to Admission medications   Medication Sig Start Date End Date Taking? Authorizing Provider  CALCIUM-VITAMIN D PO Take by mouth.    Historical Provider, MD  fexofenadine (ALLEGRA) 60 MG tablet Take 60 mg by mouth daily.    Historical Provider, MD  hydrOXYzine (ATARAX/VISTARIL) 25 MG tablet Take 1 tablet (25 mg total) by mouth every 6 (six) hours. 10/22/14   Linna Hoff, MD  ibuprofen (ADVIL,MOTRIN) 600 MG tablet Take 1 tablet (600 mg total) by mouth every 6  (six) hours as needed. 02/27/14   Francee Piccolo, PA-C  JUNEL FE 1/20 1-20 MG-MCG tablet Take 1 tablet by mouth daily. 09/23/12   Historical Provider, MD  norethindrone-ethinyl estradiol (MICROGESTIN,JUNEL,LOESTRIN) 1-20 MG-MCG tablet Take 1 tablet by mouth daily.    Historical Provider, MD  Probiotic Product (SOLUBLE FIBER/PROBIOTICS PO) Take by mouth.    Historical Provider, MD   BP 120/78 mmHg  Pulse 94  Temp(Src) 98.4 F (36.9 C) (Oral)  Resp 16  Wt 40 lb (18.144 kg)  SpO2 98%  LMP 10/11/2014 Physical Exam  Constitutional: She is oriented to person, place, and time. She appears well-developed and well-nourished.  Neck: Normal range of motion. Neck supple.  Lymphadenopathy:    She has no cervical adenopathy.  Neurological: She is alert and oriented to person, place, and time.  Skin: Skin is warm and dry. No rash noted. No erythema.  No rash evident.  Nursing note and vitals reviewed.   ED Course  Procedures (including critical care time) Labs Review Labs Reviewed - No data to display  Imaging Review No results found.   MDM   1. Pruritus of skin        Linna Hoff, MD 10/22/14 [phone]

## 2014-10-22 NOTE — ED Notes (Signed)
Reports severe itching for 24 hours.  Patient has been taking benadryl.  Visible pink, splotchy rash that patient reports itches terribly.  Denies any other illness

## 2016-03-19 HISTORY — PX: LEEP: SHX91

## 2017-03-19 ENCOUNTER — Encounter: Payer: Self-pay | Admitting: Emergency Medicine

## 2017-03-19 ENCOUNTER — Other Ambulatory Visit: Payer: Self-pay

## 2017-03-19 ENCOUNTER — Ambulatory Visit (INDEPENDENT_AMBULATORY_CARE_PROVIDER_SITE_OTHER): Payer: Self-pay

## 2017-03-19 ENCOUNTER — Ambulatory Visit
Admission: EM | Admit: 2017-03-19 | Discharge: 2017-03-19 | Disposition: A | Discharge status: Home / Self Care | Payer: Self-pay | Attending: Physician Assistant | Admitting: Physician Assistant

## 2017-03-19 DIAGNOSIS — R059 Cough, unspecified: Secondary | ICD-10-CM

## 2017-03-19 DIAGNOSIS — R05 Cough: Secondary | ICD-10-CM

## 2017-03-19 DIAGNOSIS — J069 Acute upper respiratory infection, unspecified: Secondary | ICD-10-CM

## 2017-03-19 MED ORDER — CETIRIZINE HCL 10 MG PO TABS: 10.0000 mg | ORAL_TABLET | Freq: Every day | ORAL | 0 refills | Status: DC

## 2017-03-19 MED ORDER — BENZONATATE 100 MG PO CAPS: 100.0000 mg | ORAL_CAPSULE | Freq: Three times a day (TID) | ORAL | 0 refills | Status: DC

## 2017-03-19 NOTE — Discharge Instructions (Signed)
-  Tessalon: 1 capsule every 8 hours as needed for cough -Zyrtec: 1 tablet daily for 30 days. Can also use over-the-counter Claritin or Flonase. Zyrtec is also available over-the-counter. Drainage to the back of throat could be contributing to your cough. -Push fluids -Return to clinic or follow with PCP should her symptoms worsen or not improve.

## 2017-03-19 NOTE — ED Triage Notes (Signed)
Patient c/o cough, chest congestion for a month.

## 2017-03-19 NOTE — ED Provider Notes (Signed)
[facility]    CSN: [id number] Arrival date & time: 03/19/17  1313     History   Chief Complaint Chief Complaint  Patient presents with  . Cough    HPI Brenda Knight is a 26 y.o. female.   Patient is a 26 year old female who presents complaining of cough for couple months. She said she took some Mucinex last month seemed to help but symptoms have returned. States the cough seems to be worse at night and has had some trouble sleeping. She has also taken over-the-counter p.m. medications to help her sleep. She denies any fever but does report some shortness of breath while walking up stairs. She reports some vomiting secondary to cough. She denies any chest pain patient does report some runny nose other upper history symptoms.      Past Medical History:  Diagnosis Date  . Asthma   . C. difficile colitis 2008  . Endometriosis     Patient Active Problem List   Diagnosis Date Noted  . Breast cyst 10/04/2012  . Mastodynia 09/28/2012  . Fibrocystic change of breast 09/28/2012    Past Surgical History:  Procedure Laterality Date  . 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 PELVIC LAPAROSCOPY  2014   endometriosis    OB History    Obstetric Comments   1st Menstrual Cycle: 13 1st Pregnancy: NA       Home Medications    Prior to Admission medications   Medication Sig Start Date End Date Taking? Authorizing Provider  benzonatate (TESSALON) 100 MG capsule Take 1 capsule (100 mg total) by mouth every 8 (eight) hours. 03/19/17   Candis SchatzHarris, Wilhelm Ganaway D, PA-C  CALCIUM-VITAMIN D PO Take by mouth.    [provider]  cetirizine (ZYRTEC ALLERGY) 10 MG tablet Take 1 tablet (10 mg total) by mouth daily. 03/19/17   Candis SchatzHarris, Ishaq Maffei D, PA-C  ibuprofen (ADVIL,MOTRIN) 600 MG tablet Take 1 tablet (600 mg total) by mouth every 6 (six) hours as needed. 02/27/14   Piepenbrink, Victorino DikeJennifer, PA-C    Family History Family History  Problem Relation Age of Onset  .  Cancer Mother        skin  . Cancer Maternal Aunt        breast    Social History Social History   Tobacco Use  . Smoking status: Former Smoker    Years: 1.00  . Smokeless tobacco: Never Used  Substance Use Topics  . Alcohol use: Yes  . Drug use: No     Allergies   Sulfa antibiotics   Review of Systems Review of Systems  As noted above in history of present illness. Other systems reviewed and found to be negative.   Physical Exam Triage Vital Signs ED Triage Vitals  Enc Vitals Group     BP 03/19/17 1326 113/75     Pulse Rate 03/19/17 1326 (!) 107     Resp 03/19/17 1326 14     Temp 03/19/17 1326 98.2 F (36.8 C)     Temp Source 03/19/17 1326 Oral     SpO2 03/19/17 1326 98 %     Weight 03/19/17 1323 98 lb (44.5 kg)     Height 03/19/17 1323 5\' 2"  (1.575 m)     Head Circumference --      Peak Flow --      Pain Score 03/19/17 1323 1     Pain Loc --      Pain Edu? --  Excl. in GC? --    No data found.  Updated Vital Signs BP 113/75 (BP Location: Left Arm)   Pulse (!) 107   Temp 98.2 F (36.8 C) (Oral)   Resp 14   Ht 5\' 2"  (1.575 m)   Wt 98 lb (44.5 kg)   LMP 03/13/2017 Comment: denies preg  SpO2 98%   BMI 17.92 kg/m   Visual Acuity Right Eye Distance:   Left Eye Distance:   Bilateral Distance:    Right Eye Near:   Left Eye Near:    Bilateral Near:     Physical Exam  Constitutional: She is oriented to person, place, and time. She appears well-developed and well-nourished. No distress.  HENT:  Head: Normocephalic and atraumatic.  Right Ear: Ear canal normal. A middle ear effusion is present.  Left Ear: Ear canal normal. A middle ear effusion is present.  Nose: Right sinus exhibits no maxillary sinus tenderness and no frontal sinus tenderness. Left sinus exhibits no maxillary sinus tenderness and no frontal sinus tenderness.  Mild throat erythema with clear postnasal drainage.  Eyes: EOM are normal. Pupils are equal, round, and reactive to  light.  Neck: Normal range of motion.  Cardiovascular: Normal heart sounds. Exam reveals no friction rub.  No murmur heard. Tachycardic rate, regular rhythm  Pulmonary/Chest: Effort normal and breath sounds normal. No stridor. No respiratory distress. She has no wheezes.  Abdominal: Soft. Bowel sounds are normal.  Musculoskeletal: Normal range of motion. She exhibits deformity. She exhibits no edema.  Lymphadenopathy:    She has no cervical adenopathy.  Neurological: She is alert and oriented to person, place, and time.     UC Treatments / Results  Labs (all labs ordered are listed, but only abnormal results are displayed) Labs Reviewed - No data to display  EKG  EKG Interpretation None       Radiology Dg Chest 2 View  Result Date: 03/19/2017 CLINICAL DATA:  Pt states she has been sick x 2 months with somewhat prod cough now but was worse initially, SOB upon exertion x 2 weeks with night sweats x 2 days now. Hx smoking, bronchitis and pneumonia EXAM: CHEST  2 VIEW COMPARISON:  None. FINDINGS: Lungs are hyperinflated. Heart size is normal. Lungs are clear. No pulmonary edema. Visualized osseous structures have a normal appearance. IMPRESSION: Mildly hyperinflated lungs.  Otherwise, negative exam. Electronically Signed   By: Norva PavlovElizabeth  Brown M.D.   On: 03/19/2017 14:06    Procedures Procedures (including critical care time)  Medications Ordered in UC Medications - No data to display   Initial Impression / Assessment and Plan / UC Course  I have reviewed the triage vital signs and the nursing notes.  Pertinent labs & imaging results that were available during my care of the patient were reviewed by me and considered in my medical decision making (see chart for details).    Patient with complaint of ongoing cough for about 2 months that seemed to initially improve symptoms or return. Patient reports coughing worse at night but also reports dyspnea on exertion while climbing  stairs. So with that, going check a chest x-ray. She does have bilateral ear effusions and upper respiratory symptoms as well.  Final Clinical Impressions(s) / UC Diagnoses   Final diagnoses:  Upper respiratory tract infection, unspecified type  Cough   Chest x-ray appears clear. Will give patient prescription for Tessalon as well as Zyrtec for upper respiratory issues. Drainage could be contributing to her cough.  ED Discharge Orders        Ordered    benzonatate (TESSALON) 100 MG capsule  Every 8 hours     03/19/17 1411    cetirizine (ZYRTEC ALLERGY) 10 MG tablet  Daily     03/19/17 1411       Controlled Substance Prescriptions [place] Controlled Substance Registry consulted? Not Applicable     Candis Schatz, PA-C 03/19/17 1413

## 2017-09-13 ENCOUNTER — Ambulatory Visit (INDEPENDENT_AMBULATORY_CARE_PROVIDER_SITE_OTHER): Payer: Self-pay

## 2017-09-13 ENCOUNTER — Encounter: Payer: Self-pay | Admitting: Emergency Medicine

## 2017-09-13 ENCOUNTER — Other Ambulatory Visit: Payer: Self-pay

## 2017-09-13 ENCOUNTER — Ambulatory Visit
Admission: EM | Admit: 2017-09-13 | Discharge: 2017-09-13 | Disposition: A | Discharge status: Home / Self Care | Payer: Self-pay | Attending: Family Medicine | Admitting: Family Medicine

## 2017-09-13 DIAGNOSIS — R6884 Jaw pain: Secondary | ICD-10-CM

## 2017-09-13 MED ORDER — NAPROXEN 500 MG PO TABS: 500.0000 mg | ORAL_TABLET | Freq: Two times a day (BID) | ORAL | 0 refills | Status: DC | PRN

## 2017-09-13 NOTE — ED Triage Notes (Signed)
Patient states that she was playing with her younger nephew and hit her on the right side.  Patient c/o pain in her left side of her jaw.  Patient states she can open her mouth but can not close her mouth.

## 2017-09-13 NOTE — ED Provider Notes (Signed)
[facility]    CSN: [id number] Arrival date & time: 09/13/17  1634  History   Chief Complaint Chief Complaint  Patient presents with  . Jaw Pain    left   HPI  27 year old female presents with left jaw pain.    Patient reports that she was playing with her 28 year old nephew.  He accidentally kicked her left side of her jaw.  This happened earlier today.  She reports moderate pain.  She reports no difficulty opening her jaw but has difficulty closing and biting down.  She states that she is been unable to eat.  No medications or interventions tried.  Exacerbated by activity outlined above.  No other associated symptoms.  No other complaints.  Past Medical History:  Diagnosis Date  . Asthma   . C. difficile colitis 2008  . Endometriosis    Patient Active Problem List   Diagnosis Date Noted  . Breast cyst 10/04/2012  . Mastodynia 09/28/2012  . Fibrocystic change of breast 09/28/2012   Past Surgical History:  Procedure Laterality Date  . 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 PELVIC LAPAROSCOPY  2014   endometriosis   OB History   None    Obstetric Comments  1st Menstrual Cycle: 13 1st Pregnancy: NA       Home Medications    Prior to Admission medications   Medication Sig Start Date End Date Taking? Authorizing Provider  naproxen (NAPROSYN) 500 MG tablet Take 1 tablet (500 mg total) by mouth 2 (two) times daily as needed. 09/13/17   Tommie Samsook, Franchot Pollitt G, DO    Family History Family History  Problem Relation Age of Onset  . Cancer Mother        skin  . Cancer Maternal Aunt        breast    Social History Social History   Tobacco Use  . Smoking status: Former Smoker    Years: 1.00  . Smokeless tobacco: Never Used  Substance Use Topics  . Alcohol use: Yes  . Drug use: No     Allergies   Sulfa antibiotics   Review of Systems Review of Systems  Constitutional: Negative.   HENT:       Jaw pain (left).   Physical Exam Triage Vital  Signs ED Triage Vitals  Enc Vitals Group     BP 09/13/17 1717 120/65     Pulse Rate 09/13/17 1717 95     Resp 09/13/17 1717 14     Temp 09/13/17 1717 98.9 F (37.2 C)     Temp Source 09/13/17 1717 Oral     SpO2 09/13/17 1717 100 %     Weight 09/13/17 1716 105 lb (47.6 kg)     Height 09/13/17 1716 5\' 2"  (1.575 m)     Head Circumference --      Peak Flow --      Pain Score 09/13/17 1715 6     Pain Loc --      Pain Edu? --      Excl. in GC? --    Updated Vital Signs BP 120/65 (BP Location: Left Arm)   Pulse 95   Temp 98.9 F (37.2 C) (Oral)   Resp 14   Ht 5\' 2"  (1.575 m)   Wt 105 lb (47.6 kg)   LMP 08/23/2017 (Approximate) Comment: denies preg  SpO2 100%   BMI 19.20 kg/m     Physical Exam  Constitutional: She is oriented to person, place, and time. She  appears well-developed. No distress.  HENT:  Head: Normocephalic and atraumatic.  Mouth/Throat: Oropharynx is clear and moist.  Patient with tenderness to palpation around the left TMJ.  No trismus.  Cardiovascular: Normal rate and regular rhythm.  Pulmonary/Chest: Effort normal and breath sounds normal. She has no wheezes. She has no rales.  Neurological: She is alert and oriented to person, place, and time.  Psychiatric: Her behavior is normal.  Flat affect.  Nursing note and vitals reviewed.  UC Treatments / Results  Labs (all labs ordered are listed, but only abnormal results are displayed) Labs Reviewed - No data to display  EKG None  Radiology Dg Mandible 4 Views  Result Date: 09/13/2017 CLINICAL DATA:  Left-sided jaw pain after injury. EXAM: MANDIBLE - 4+ VIEW COMPARISON:  None. FINDINGS: There is no evidence of fracture or other focal bone lesions. IMPRESSION: Normal mandible. Electronically Signed   By: Lupita Raider, M.D.   On: 09/13/2017 18:30    Procedures Procedures (including critical care time)  Medications Ordered in UC Medications - No data to display  Initial Impression / Assessment and  Plan / UC Course  I have reviewed the triage vital signs and the nursing notes.  Pertinent labs & imaging results that were available during my care of the patient were reviewed by me and considered in my medical decision making (see chart for details).    27 year old female presents with jaw pain.  Mildly tender.  No evidence of fracture or dislocation.  X-ray obtained and was negative.  Naproxen as prescribed.  Soft foods.  Supportive care.  Final Clinical Impressions(s) / UC Diagnoses   Final diagnoses:  Jaw pain     Discharge Instructions     Xray negative.  Medication as prescribed.  Soft foods for the next few days.  Take care  Dr. Adriana Simas    ED Prescriptions    Medication Sig Dispense Auth. Provider   naproxen (NAPROSYN) 500 MG tablet Take 1 tablet (500 mg total) by mouth 2 (two) times daily as needed. 30 tablet Tommie Sams, DO     Controlled Substance Prescriptions [place] Controlled Substance Registry consulted? Not Applicable   Tommie Sams, DO 09/13/17 1844

## 2017-09-13 NOTE — Discharge Instructions (Signed)
Xray negative.  Medication as prescribed.  Soft foods for the next few days.  Take care  Dr. Adriana Simasook

## 2018-01-20 ENCOUNTER — Encounter: Payer: Self-pay | Admitting: Obstetrics and Gynecology

## 2018-01-20 ENCOUNTER — Ambulatory Visit (INDEPENDENT_AMBULATORY_CARE_PROVIDER_SITE_OTHER): Payer: Self-pay | Admitting: Obstetrics and Gynecology

## 2018-01-20 ENCOUNTER — Other Ambulatory Visit (HOSPITAL_COMMUNITY)
Admission: RE | Admit: 2018-01-20 | Discharge: 2018-01-20 | Disposition: A | Discharge status: Home / Self Care | Payer: Self-pay | Source: Ambulatory Visit | Attending: Obstetrics and Gynecology | Admitting: Obstetrics and Gynecology

## 2018-01-20 VITALS — BP 112/66 | HR 87 | Ht 62.0 in | Wt 111.0 lb

## 2018-01-20 DIAGNOSIS — Z01419 Encounter for gynecological examination (general) (routine) without abnormal findings: Secondary | ICD-10-CM

## 2018-01-20 DIAGNOSIS — Z1239 Encounter for other screening for malignant neoplasm of breast: Secondary | ICD-10-CM

## 2018-01-20 DIAGNOSIS — Z124 Encounter for screening for malignant neoplasm of cervix: Secondary | ICD-10-CM | POA: Insufficient documentation

## 2018-01-20 NOTE — Progress Notes (Signed)
Gynecology Annual Exam   PCP: Patient, No Pcp Per  Chief Complaint:  Chief Complaint  Patient presents with  . Gynecologic Exam    History of Present Illness: Patient is a 27 y.o. G0P0000 presents for annual exam. The patient has no complaints today.   LMP: Patient's last menstrual period was 12/30/2017 (exact date). Average Interval: regular, 28 days Duration of flow: 4 days Heavy Menses: no Clots: no Intermenstrual Bleeding: no Postcoital Bleeding: no Dysmenorrhea: no  The patient is sexually active. She currently uses condoms for contraception. She admits to occasional dyspareunia.  The patient does perform self breast exams.  There is no notable family history of breast or ovarian cancer in her family.  The patient wears seatbelts: yes.   The patient has regular exercise: not asked.    The patient denies current symptoms of depression.    Patient has done well in the interim from an endometriosis standpoint off of medications.  She is not contemplating pregnancy in the near future.    Review of Systems: Review of Systems  Constitutional: Negative for chills and fever.  HENT: Negative for congestion.   Respiratory: Negative for cough and shortness of breath.   Cardiovascular: Negative for chest pain and palpitations.  Gastrointestinal: Negative for abdominal pain, constipation, diarrhea, heartburn, nausea and vomiting.  Genitourinary: Negative for dysuria, frequency and urgency.  Skin: Negative for itching and rash.  Neurological: Negative for dizziness and headaches.  Endo/Heme/Allergies: Negative for polydipsia.  Psychiatric/Behavioral: Negative for depression.    Past Medical History:  Past Medical History:  Diagnosis Date  . Asthma   . C. difficile colitis 2008  . Endometriosis     Past Surgical History:  Past Surgical History:  Procedure Laterality Date  . 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 COLPOSCOPY W/ BIOPSY / CURETTAGE  01/2016, 2014    CIN 2-3 (2017)  . LEEP  03/19/2016   CIN 1  . PELVIC LAPAROSCOPY  2014   endometriosis    Gynecologic History:  Patient's last menstrual period was 12/30/2017 (exact date). Contraception: condoms  Obstetric History: G0P0000  Family History:  Family History  Problem Relation Age of Onset  . Cancer Maternal Aunt 77       breast    Social History:  Social History   Socioeconomic History  . Marital status: Single    Spouse name: Not on file  . Number of children: Not on file  . Years of education: Not on file  . Highest education level: Not on file  Occupational History  . Not on file  Social Needs  . Financial resource strain: Not on file  . Food insecurity:    Worry: Not on file    Inability: Not on file  . Transportation needs:    Medical: Not on file    Non-medical: Not on file  Tobacco Use  . Smoking status: Former Smoker    Years: 1.00  . Smokeless tobacco: Never Used  Substance and Sexual Activity  . Alcohol use: Yes  . Drug use: No  . Sexual activity: Yes    Birth control/protection: None  Lifestyle  . Physical activity:    Days per week: Not on file    Minutes per session: Not on file  . Stress: Not on file  Relationships  . Social connections:    Talks on phone: Not on file    Gets together: Not on file    Attends religious service: Not on  file    Active member of club or organization: Not on file    Attends meetings of clubs or organizations: Not on file    Relationship status: Not on file  . Intimate partner violence:    Fear of current or ex partner: Not on file    Emotionally abused: Not on file    Physically abused: Not on file    Forced sexual activity: Not on file  Other Topics Concern  . Not on file  Social History Narrative  . Not on file    Allergies:  Allergies  Allergen Reactions  . Sulfa Antibiotics Shortness Of Breath and Other (See Comments)    "Skin turns red" per patient    Medications: Prior to Admission  medications   Not on File    Physical Exam Vitals: Blood pressure 112/66, pulse 87, height 5\' 2"  (1.575 m), weight 111 lb (50.3 kg), last menstrual period 12/30/2017.  General: NAD HEENT: normocephalic, anicteric Thyroid: no enlargement, no palpable nodules Pulmonary: No increased work of breathing, CTAB Cardiovascular: RRR, distal pulses 2+ Breast: Breast symmetrical, no tenderness, no palpable nodules or masses, no skin or nipple retraction present, no nipple discharge.  No axillary or supraclavicular lymphadenopathy. Abdomen: NABS, soft, non-tender, non-distended.  Umbilicus without lesions.  No hepatomegaly, splenomegaly or masses palpable. No evidence of hernia  Genitourinary:  External: Normal external female genitalia.  Normal urethral meatus, normal Bartholin's and Skene's glands.    Vagina: Normal vaginal mucosa, no evidence of prolapse.    Cervix: Grossly normal in appearance, no bleeding  Uterus: Non-enlarged, mobile, normal contour.  No CMT  Adnexa: ovaries non-enlarged, no adnexal masses  Rectal: deferred  Lymphatic: no evidence of inguinal lymphadenopathy Extremities: no edema, erythema, or tenderness Neurologic: Grossly intact Psychiatric: mood appropriate, affect full  Female chaperone present for pelvic and breast  portions of the physical exam    Assessment: 27 y.o. G0P0000 routine annual exam  Plan: Problem List Items Addressed This Visit    None    Visit Diagnoses    Encounter for gynecological examination without abnormal finding    -  Primary   Screening for malignant neoplasm of cervix       Relevant Orders   Cytology - PAP   Breast screening          2) STI screening  wasoffered and declined  2)  ASCCP guidelines and rational discussed.  Patient opts for every 3 years screening interval  3) Contraception - the patient is currently using  condoms.  She is happy with her current form of contraception and plans to continue  4) Routine healthcare  maintenance including cholesterol, diabetes screening discussed Declines  5) Return in about 1 year (around 01/21/2019) for annual.   Vena Austria, MD, [facility], [facility] 01/20/2018, 8:44 AM

## 2018-01-26 LAB — CYTOLOGY - PAP
DIAGNOSIS: UNDETERMINED — AB
HPV (WINDOPATH): NOT DETECTED

## 2018-04-25 ENCOUNTER — Ambulatory Visit: Payer: Self-pay | Admitting: Obstetrics and Gynecology

## 2018-04-27 ENCOUNTER — Encounter: Payer: Self-pay | Admitting: Obstetrics and Gynecology

## 2018-04-27 ENCOUNTER — Other Ambulatory Visit (HOSPITAL_COMMUNITY)
Admission: RE | Admit: 2018-04-27 | Discharge: 2018-04-27 | Disposition: A | Discharge status: Home / Self Care | Payer: Self-pay | Source: Ambulatory Visit | Attending: Obstetrics and Gynecology | Admitting: Obstetrics and Gynecology

## 2018-04-27 ENCOUNTER — Ambulatory Visit (INDEPENDENT_AMBULATORY_CARE_PROVIDER_SITE_OTHER): Payer: Self-pay | Admitting: Obstetrics and Gynecology

## 2018-04-27 VITALS — BP 120/80 | Ht 62.0 in | Wt 106.0 lb

## 2018-04-27 DIAGNOSIS — N939 Abnormal uterine and vaginal bleeding, unspecified: Secondary | ICD-10-CM | POA: Insufficient documentation

## 2018-04-27 DIAGNOSIS — O26899 Other specified pregnancy related conditions, unspecified trimester: Secondary | ICD-10-CM | POA: Insufficient documentation

## 2018-04-27 DIAGNOSIS — Z113 Encounter for screening for infections with a predominantly sexual mode of transmission: Secondary | ICD-10-CM

## 2018-04-27 DIAGNOSIS — R102 Pelvic and perineal pain: Secondary | ICD-10-CM | POA: Insufficient documentation

## 2018-04-27 NOTE — Progress Notes (Signed)
Gynecology Abnormal Uterine Bleeding Initial Evaluation   Chief Complaint:  Chief Complaint  Patient presents with  . Menstrual Problem    periods only lasting 1 day    History of Present Illness:    Paitient is a 28 y.o. G0P0000 who LMP was Patient's last menstrual period was 04/19/2018., presents today for a problem visit.  She complains of metrorrhagia (midcycle) that  began about a month ago and its severity is described as mild.  The patient menstrual complaints are acuteShe had previously reported regular monthly menses. The patient is sexually active. She currently uses nonefor contraception.  Last Pap results esults were obtained 01/20/2018 ASCUS with NEGATIVE high risk HPV   She also reports increasing dysmenorrhea in setting of prior diagnosis of endometriosis.    Previous evaluation: None Previous Treatment: has been continuous OCP and norethindrone  LMP: Patient's last menstrual period was 04/19/2018.  Review of Systems: Review of Systems  Constitutional: Negative.   Gastrointestinal: Positive for abdominal pain.  Genitourinary: Negative.   Skin: Negative.     Past Medical History:  Past Medical History:  Diagnosis Date  . Asthma   . C. difficile colitis 2008  . Endometriosis     Past Surgical History:  Past Surgical History:  Procedure Laterality Date  . 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 COLPOSCOPY W/ BIOPSY / CURETTAGE  01/2016, 2014   CIN 2-3 (2017)  . LEEP  03/19/2016   CIN 1  . PELVIC LAPAROSCOPY  2014   endometriosis    Obstetric History: G0P0000  Family History:  Family History  Problem Relation Age of Onset  . Cancer Maternal Aunt 21       breast    Social History:  Social History   Socioeconomic History  . Marital status: Single    Spouse name: Not on file  . Number of children: Not on file  . Years of education: Not on file  . Highest education level: Not on file  Occupational History  . Not on file  Social  Needs  . Financial resource strain: Not on file  . Food insecurity:    Worry: Not on file    Inability: Not on file  . Transportation needs:    Medical: Not on file    Non-medical: Not on file  Tobacco Use  . Smoking status: Former Smoker    Years: 1.00  . Smokeless tobacco: Never Used  Substance and Sexual Activity  . Alcohol use: Yes  . Drug use: No  . Sexual activity: Yes    Birth control/protection: None  Lifestyle  . Physical activity:    Days per week: Not on file    Minutes per session: Not on file  . Stress: Not on file  Relationships  . Social connections:    Talks on phone: Not on file    Gets together: Not on file    Attends religious service: Not on file    Active member of club or organization: Not on file    Attends meetings of clubs or organizations: Not on file    Relationship status: Not on file  . Intimate partner violence:    Fear of current or ex partner: Not on file    Emotionally abused: Not on file    Physically abused: Not on file    Forced sexual activity: Not on file  Other Topics Concern  . Not on file  Social History Narrative  . Not on file  Allergies:  Allergies  Allergen Reactions  . Sulfa Antibiotics Shortness Of Breath and Other (See Comments)    "Skin turns red" per patient    Medications: Prior to Admission medications   Not on File    Physical Exam Blood pressure 120/80, height 5\' 2"  (1.575 m), weight 106 lb (48.1 kg), last menstrual period 04/19/2018.  Patient's last menstrual period was 04/19/2018.  General: NAD HEENT: normocephalic, anicteric Pulmonary: No increased work of breathing Genitourinary:  External: Normal external female genitalia.  Normal urethral meatus, normal Bartholin's and Skene's glands.    Vagina: Normal vaginal mucosa, no evidence of prolapse.    Cervix: Grossly normal in appearance, no bleeding  Uterus: Non-enlarged, mobile, normal contour.  No CMT  Adnexa: ovaries non-enlarged, no adnexal  masses  Rectal: deferred  Lymphatic: no evidence of inguinal lymphadenopathy Extremities: no edema, erythema, or tenderness Neurologic: Grossly intact Psychiatric: mood appropriate, affect full  Female chaperone present for pelvic portions of the physical exam  Assessment: 28 y.o. G0P0000 with abnormal uterine bleeding  Plan: Problem List Items Addressed This Visit    None    Visit Diagnoses    Abnormal uterine bleeding    -  Primary   Relevant Orders   US Transvaginal Non-OB   Cervicovaginal ancillary only (Completed)   17-Hydroxyprogesterone   Androstenedione   DHEA-sulfate   FSH/LH   Prolactin   Testosterone,Free and Total   TSH   Pelvic pain       Relevant Orders   US Transvaginal Non-OB   Cervicovaginal ancillary only (Completed)   Routine screening for STI (sexually transmitted infection)       Relevant Orders   Cervicovaginal ancillary only (Completed)      1) Discussed management options for abnormal uterine bleeding including expectant, NSAIDs, tranexamic acid (Lysteda), oral progesterone (Provera, norethindrone, megace), Depo Provera, Levonorgestrel containing IUD, endometrial ablation (Novasure) or hysterectomy as definitive surgical management.  Discussed risks and benefits of each method.   Final management decision will hinge on results of patient's work up and whether an underlying etiology for the patients bleeding symptoms can be discerned.  We will conduct a basic work up examining using the PALM-COIEN classification system. Bleeding precautions reviewed.  - GC/CT cultures obtained - Discussed Orilissa   2) Return in about 1 week (around 05/04/2018) for GYN and TVUS.   Vena Austria, MD, [facility], [facility]

## 2018-04-28 LAB — CERVICOVAGINAL ANCILLARY ONLY
CHLAMYDIA, DNA PROBE: NEGATIVE
NEISSERIA GONORRHEA: NEGATIVE
TRICH (WINDOWPATH): NEGATIVE

## 2018-05-02 LAB — 17-HYDROXYPROGESTERONE: 17 HYDROXYPROGESTERONE: 59 ng/dL

## 2018-05-02 LAB — TESTOSTERONE,FREE AND TOTAL
TESTOSTERONE FREE: 0.6 pg/mL (ref 0.0–4.2)
TESTOSTERONE: 21 ng/dL (ref 8–48)

## 2018-05-02 LAB — DHEA-SULFATE: DHEA-SO4: 197.7 ug/dL (ref 84.8–378.0)

## 2018-05-02 LAB — FSH/LH
FSH: 4.5 m[IU]/mL
LH: 11.6 m[IU]/mL

## 2018-05-02 LAB — ANDROSTENEDIONE: Androstenedione: 60 ng/dL (ref 41–262)

## 2018-05-02 LAB — TSH: TSH: 1.7 u[IU]/mL (ref 0.450–4.500)

## 2018-05-02 LAB — PROLACTIN: Prolactin: 20.4 ng/mL (ref 4.8–23.3)

## 2018-05-10 ENCOUNTER — Ambulatory Visit (INDEPENDENT_AMBULATORY_CARE_PROVIDER_SITE_OTHER): Payer: Self-pay

## 2018-05-10 ENCOUNTER — Encounter: Payer: Self-pay | Admitting: Obstetrics and Gynecology

## 2018-05-10 ENCOUNTER — Ambulatory Visit (INDEPENDENT_AMBULATORY_CARE_PROVIDER_SITE_OTHER): Payer: Self-pay | Admitting: Obstetrics and Gynecology

## 2018-05-10 VITALS — BP 98/62 | HR 68 | Wt 107.0 lb

## 2018-05-10 DIAGNOSIS — N939 Abnormal uterine and vaginal bleeding, unspecified: Secondary | ICD-10-CM

## 2018-05-10 DIAGNOSIS — N83292 Other ovarian cyst, left side: Secondary | ICD-10-CM

## 2018-05-10 DIAGNOSIS — R102 Pelvic and perineal pain: Secondary | ICD-10-CM

## 2018-05-10 DIAGNOSIS — N946 Dysmenorrhea, unspecified: Secondary | ICD-10-CM

## 2018-05-10 DIAGNOSIS — N809 Endometriosis, unspecified: Secondary | ICD-10-CM

## 2018-05-10 NOTE — Progress Notes (Signed)
Gynecology Ultrasound Follow Up  Chief Complaint:  Chief Complaint  Patient presents with  . Follow-up    GYN U/S     History of Present Illness: Patient is a 28 y.o. female who presents today for ultrasound evaluation of abnormal uterine bleeding and dysmenorrhea .  Ultrasound demonstrates the following findgins Adnexa: no masses seen  Uterus: Non-enlarged, no evidence of fibroids with endometrial stripe normal trilaminar without evidence of focal endometrial abnormalities Additional: no free fluid  In addition we reviewed recent laboratory work which did not show any hormonal abnormalities to explain recent change in menstrual flow or dysmenorrhea.    Review of Systems: Review of Systems  Constitutional: Negative.   Gastrointestinal: Positive for abdominal pain.  Genitourinary: Negative.   Skin: Negative.     Past Medical History:  Past Medical History:  Diagnosis Date  . Asthma   . C. difficile colitis 2008  . Endometriosis     Past Surgical History:  Past Surgical History:  Procedure Laterality Date  . 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 COLPOSCOPY W/ BIOPSY / CURETTAGE  01/2016, 2014   CIN 2-3 (2017)  . LEEP  03/19/2016   CIN 1  . PELVIC LAPAROSCOPY  2014   endometriosis    Gynecologic History:  Patient's last menstrual period was 04/19/2018.  Family History:  Family History  Problem Relation Age of Onset  . Cancer Maternal Aunt 46       breast    Social History:  Social History   Socioeconomic History  . Marital status: Single    Spouse name: Not on file  . Number of children: Not on file  . Years of education: Not on file  . Highest education level: Not on file  Occupational History  . Not on file  Social Needs  . Financial resource strain: Not on file  . Food insecurity:    Worry: Not on file    Inability: Not on file  . Transportation needs:    Medical: Not on file    Non-medical: Not on file  Tobacco Use  . Smoking  status: Former Smoker    Years: 1.00  . Smokeless tobacco: Never Used  Substance and Sexual Activity  . Alcohol use: Yes  . Drug use: No  . Sexual activity: Yes    Birth control/protection: None  Lifestyle  . Physical activity:    Days per week: Not on file    Minutes per session: Not on file  . Stress: Not on file  Relationships  . Social connections:    Talks on phone: Not on file    Gets together: Not on file    Attends religious service: Not on file    Active member of club or organization: Not on file    Attends meetings of clubs or organizations: Not on file    Relationship status: Not on file  . Intimate partner violence:    Fear of current or ex partner: Not on file    Emotionally abused: Not on file    Physically abused: Not on file    Forced sexual activity: Not on file  Other Topics Concern  . Not on file  Social History Narrative  . Not on file    Allergies:  Allergies  Allergen Reactions  . Sulfa Antibiotics Shortness Of Breath and Other (See Comments)    "Skin turns red" per patient    Medications: Prior to Admission medications   Not on File  Physical Exam Vitals: Blood pressure 98/62, pulse 68, weight 107 lb (48.5 kg), last menstrual period 04/19/2018.  General: NAD HEENT: normocephalic, anicteric Pulmonary: No increased work of breathing Extremities: no edema, erythema, or tenderness Neurologic: Grossly intact, normal gait Psychiatric: mood appropriate, affect full   Assessment: 28 y.o. G0P0000 follow up [facility] and dysmenorrhea  Plan: Problem List Items Addressed This Visit    None    Visit Diagnoses    Endometriosis determined by laparoscopy    -  Primary   Dysmenorrhea       Abnormal uterine bleeding          1) AUB/DYSMENORRHEA - in the setting of known endometriosis.  We discussed management options including hormonal management, orilissa, or ponstel.  Given labs and ultrasound look normal patient feels sufficiently reassured and  elects for expectant management.  We discussed should symptoms become more bothersome in the next year either as far as bleeding abnormalities or dysmenorrhea options remain open.  2) A total of 15 minutes were spent in face-to-face contact with the patient during this encounter with over half of that time devoted to counseling and coordination of care.  3) Return in about 8 months (around 01/08/2019) for annual.   Vena Austria, MD, [facility], [facility]

## 2018-12-20 ENCOUNTER — Ambulatory Visit: Payer: Self-pay | Admitting: Adult Health

## 2018-12-20 ENCOUNTER — Other Ambulatory Visit: Payer: Self-pay

## 2018-12-20 ENCOUNTER — Encounter: Payer: Self-pay | Admitting: Adult Health

## 2018-12-20 VITALS — BP 98/68 | HR 96 | Resp 16 | Ht 62.0 in | Wt 103.0 lb

## 2018-12-20 DIAGNOSIS — K2100 Gastro-esophageal reflux disease with esophagitis, without bleeding: Secondary | ICD-10-CM

## 2018-12-20 DIAGNOSIS — J4520 Mild intermittent asthma, uncomplicated: Secondary | ICD-10-CM

## 2018-12-20 MED ORDER — OMEPRAZOLE MAGNESIUM 20 MG PO TBEC: 20.0000 mg | DELAYED_RELEASE_TABLET | Freq: Every day | ORAL | 2 refills | Status: DC

## 2018-12-20 MED ORDER — ALBUTEROL SULFATE HFA 108 (90 BASE) MCG/ACT IN AERS: 2.0000 | INHALATION_SPRAY | Freq: Four times a day (QID) | RESPIRATORY_TRACT | 2 refills | Status: DC | PRN

## 2018-12-20 NOTE — Progress Notes (Signed)
[facility] [place], [place] [id number]  Internal MEDICINE  Office Visit Note  Patient Name: Brenda Knight  [id number]  [id number]  Date of Service: 12/20/2018   Complaints/HPI Pt is here for establishment of PCP. Chief Complaint  Patient presents with  . Medical Management of Chronic Issues    new patient , having issues with heartburn   . Asthma   HPI Pt is here to establish care.  She reports a history of asthma, that she feels has not been under control recently.  She is also complaining of heartburn that has been going on for some time.  She reports she would have terrible nausea at times, and she has changed her diet to help with this. She has tried some otc medicine for a couple weeks.  This medicine worked with the change in diet. She says if she drinks one cup of coffee she feels bad all day. She reports her dad has had some serious esophageal issues and his needed surgery to correct this issue.   Current Medication: Outpatient Encounter Medications as of 12/20/2018  Medication Sig  . albuterol (VENTOLIN HFA) 108 (90 Base) MCG/ACT inhaler Inhale 2 puffs into the lungs every 6 (six) hours as needed for wheezing or shortness of breath.  [name] omeprazole (PRILOSEC OTC) 20 MG tablet Take 1 tablet (20 mg total) by mouth daily.   No facility-administered encounter medications on file as of 12/20/2018.     Surgical History: Past Surgical History:  Procedure Laterality Date  . 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 COLPOSCOPY W/ BIOPSY / CURETTAGE  01/2016, 2014   CIN 2-3 (2017)  . LEEP  03/19/2016   CIN 1  . PELVIC LAPAROSCOPY  2014   endometriosis    Medical History: Past Medical History:  Diagnosis Date  . Asthma   . C. difficile colitis 2008  . Endometriosis     Family History: Family History  Problem Relation Age of Onset  . Cancer Maternal Aunt 40       breast    Social History   Socioeconomic History  . Marital status:  Single    Spouse name: Not on file  . Number of children: Not on file  . Years of education: Not on file  . Highest education level: Not on file  Occupational History  . Not on file  Social Needs  . Financial resource strain: Not on file  . Food insecurity    Worry: Not on file    Inability: Not on file  . Transportation needs    Medical: Not on file    Non-medical: Not on file  Tobacco Use  . Smoking status: Former Smoker    Years: 1.00  . Smokeless tobacco: Never Used  Substance and Sexual Activity  . Alcohol use: Yes  . Drug use: No  . Sexual activity: Yes    Birth control/protection: None  Lifestyle  . Physical activity    Days per week: Not on file    Minutes per session: Not on file  . Stress: Not on file  Relationships  . Social Herbalist on phone: Not on file    Gets together: Not on file    Attends religious service: Not on file    Active member of club or organization: Not on file    Attends meetings of clubs or organizations: Not on file    Relationship status: Not on file  . Intimate  partner violence    Fear of current or ex partner: Not on file    Emotionally abused: Not on file    Physically abused: Not on file    Forced sexual activity: Not on file  Other Topics Concern  . Not on file  Social History Narrative  . Not on file     Review of Systems  Constitutional: Negative for chills, fatigue and unexpected weight change.  HENT: Negative for congestion, rhinorrhea, sneezing and sore throat.   Eyes: Negative for photophobia, pain and redness.  Respiratory: Negative for cough, chest tightness and shortness of breath.   Cardiovascular: Negative for chest pain and palpitations.  Gastrointestinal: Negative for abdominal pain, constipation, diarrhea, nausea and vomiting.  Endocrine: Negative.   Genitourinary: Negative for dysuria and frequency.  Musculoskeletal: Negative for arthralgias, back pain, joint swelling and neck pain.  Skin:  Negative for rash.  Allergic/Immunologic: Negative.   Neurological: Negative for tremors and numbness.  Hematological: Negative for adenopathy. Does not bruise/bleed easily.  Psychiatric/Behavioral: Negative for behavioral problems and sleep disturbance. The patient is not nervous/anxious.     Vital Signs: BP 98/68   Pulse 96   Resp 16   Ht 5\' 2"  (1.575 m)   Wt 103 lb (46.7 kg)   SpO2 99%   BMI 18.84 kg/m    Physical Exam Vitals signs and nursing note reviewed.  Constitutional:      General: She is not in acute distress.    Appearance: She is well-developed. She is not diaphoretic.  HENT:     Head: Normocephalic and atraumatic.     Mouth/Throat:     Pharynx: No oropharyngeal exudate.  Eyes:     Pupils: Pupils are equal, round, and reactive to light.  Neck:     Musculoskeletal: Normal range of motion and neck supple.     Thyroid: No thyromegaly.     Vascular: No JVD.     Trachea: No tracheal deviation.  Cardiovascular:     Rate and Rhythm: Normal rate and regular rhythm.     Heart sounds: Normal heart sounds. No murmur. No friction rub. No gallop.   Pulmonary:     Effort: Pulmonary effort is normal. No respiratory distress.     Breath sounds: Normal breath sounds. No wheezing or rales.  Chest:     Chest wall: No tenderness.  Abdominal:     Palpations: Abdomen is soft.     Tenderness: There is no abdominal tenderness. There is no guarding.  Musculoskeletal: Normal range of motion.  Lymphadenopathy:     Cervical: No cervical adenopathy.  Skin:    General: Skin is warm and dry.  Neurological:     Mental Status: She is alert and oriented to person, place, and time.     Cranial Nerves: No cranial nerve deficit.  Psychiatric:        Behavior: Behavior normal.        Thought Content: Thought content normal.        Judgment: Judgment normal.    Assessment/Plan: 1. Mild intermittent asthma without complication Stable, continue present management.  Use Inhaler as  directed. Will get PFT's to evaluate present levels. - Pulmonary Function Test; Future - albuterol (VENTOLIN HFA) 108 (90 Base) MCG/ACT inhaler; Inhale 2 puffs into the lungs every 6 (six) hours as needed for wheezing or shortness of breath.  Dispense: 18 g; Refill: 2  2. Gastroesophageal reflux disease with esophagitis Stable, continue present management.  - omeprazole (PRILOSEC OTC) 20 MG  tablet; Take 1 tablet (20 mg total) by mouth daily.  Dispense: 30 tablet; Refill: 2  General Counseling: Takeya verbalizes understanding of the findings of todays visit and agrees with plan of treatment. I have discussed any further diagnostic evaluation that may be needed or ordered today. We also reviewed her medications today. she has been encouraged to call the office with any questions or concerns that should arise related to todays visit.  Orders Placed This Encounter  Procedures  . Pulmonary Function Test    Meds ordered this encounter  Medications  . albuterol (VENTOLIN HFA) 108 (90 Base) MCG/ACT inhaler    Sig: Inhale 2 puffs into the lungs every 6 (six) hours as needed for wheezing or shortness of breath.    Dispense:  18 g    Refill:  2  . omeprazole (PRILOSEC OTC) 20 MG tablet    Sig: Take 1 tablet (20 mg total) by mouth daily.    Dispense:  30 tablet    Refill:  2    Time spent: 35 Minutes   This patient was seen by Blima Ledger AGNP-C in Collaboration with Dr Lyndon Code as a part of collaborative care agreement  Johnna Acosta AGNP-C Internal Medicine

## 2018-12-21 ENCOUNTER — Encounter: Payer: Self-pay | Admitting: Adult Health

## 2019-01-25 ENCOUNTER — Ambulatory Visit: Payer: Self-pay | Admitting: Internal Medicine

## 2019-01-31 ENCOUNTER — Ambulatory Visit: Payer: Self-pay | Admitting: Internal Medicine

## 2019-02-09 ENCOUNTER — Ambulatory Visit: Payer: Self-pay | Admitting: Internal Medicine

## 2019-02-27 ENCOUNTER — Telehealth: Payer: Self-pay

## 2019-02-27 NOTE — Telephone Encounter (Signed)
Called lmom informing patient of appointment. klh 

## 2019-03-01 ENCOUNTER — Telehealth: Payer: Self-pay

## 2019-03-01 ENCOUNTER — Ambulatory Visit: Payer: Self-pay | Admitting: Internal Medicine

## 2019-03-01 NOTE — Telephone Encounter (Signed)
BILLED MISSED APPT FEE 03/01/2019. BETH

## 2019-04-25 ENCOUNTER — Other Ambulatory Visit: Payer: Self-pay

## 2019-04-25 ENCOUNTER — Encounter: Payer: Self-pay | Admitting: Obstetrics and Gynecology

## 2019-04-25 ENCOUNTER — Ambulatory Visit (INDEPENDENT_AMBULATORY_CARE_PROVIDER_SITE_OTHER): Payer: Self-pay | Admitting: Obstetrics and Gynecology

## 2019-04-25 VITALS — BP 110/60 | Ht 62.0 in | Wt 108.0 lb

## 2019-04-25 DIAGNOSIS — B373 Candidiasis of vulva and vagina: Secondary | ICD-10-CM

## 2019-04-25 DIAGNOSIS — B3731 Acute candidiasis of vulva and vagina: Secondary | ICD-10-CM

## 2019-04-25 MED ORDER — FLUCONAZOLE 150 MG PO TABS
150.0000 mg | ORAL_TABLET | Freq: Once | ORAL | 0 refills | Status: AC
Start: 2019-04-25 — End: 2019-04-25

## 2019-04-25 NOTE — Progress Notes (Signed)
Obstetrics & Gynecology Office Visit   Chief Complaint:  Chief Complaint  Patient presents with  . Vaginal irritation    discharge w/burning, itching    History of Present Illness: Ms. STARLYNN KLINKNER is a 29 y.o. G0P0000 who LMP was Patient's last menstrual period was 04/04/2019., presents today for a problem visit.   Patient complains of an abnormal vaginal discharge over the past week. Discharge described as: thin. Vaginal symptoms include local irritation.   Other associated symptoms: none.Menstrual pattern:   She denies recent antibiotic exposure, denies changes in soaps, detergents coinciding with the onset of her symptoms.  She has not previously self treated or been under treatment by another provider for these symptoms.   Feels over the past few menstrual cycles symptoms have coincided with onset of menses to slightly after cessation of menstrual cycle.  Review of Systems: Review of Systems  Constitutional: Positive for fever.  Gastrointestinal: Negative.   Genitourinary: Negative.   Skin: Positive for itching.    Past Medical History:  Past Medical History:  Diagnosis Date  . Asthma   . C. difficile colitis 2008  . Endometriosis     Past Surgical History:  Past Surgical History:  Procedure Laterality Date  . 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 COLPOSCOPY W/ BIOPSY / CURETTAGE  01/2016, 2014   CIN 2-3 (2017)  . LEEP  03/19/2016   CIN 1  . PELVIC LAPAROSCOPY  2014   endometriosis    Gynecologic History: Patient's last menstrual period was 04/04/2019.  Obstetric History: G0P0000  Family History:  Family History  Problem Relation Age of Onset  . Cancer Maternal Aunt 74       breast    Social History:  Social History   Socioeconomic History  . Marital status: Single    Spouse name: Not on file  . Number of children: Not on file  . Years of education: Not on file  . Highest education level: Not on file  Occupational History  . Not on  file  Tobacco Use  . Smoking status: Former Smoker    Years: 1.00  . Smokeless tobacco: Never Used  Substance and Sexual Activity  . Alcohol use: Yes  . Drug use: No  . Sexual activity: Yes    Birth control/protection: None  Other Topics Concern  . Not on file  Social History Narrative  . Not on file   Social Determinants of Health   Financial Resource Strain:   . Difficulty of Paying Living Expenses: Not on file  Food Insecurity:   . Worried About Charity fundraiser in the Last Year: Not on file  . Ran Out of Food in the Last Year: Not on file  Transportation Needs:   . Lack of Transportation (Medical): Not on file  . Lack of Transportation (Non-Medical): Not on file  Physical Activity:   . Days of Exercise per Week: Not on file  . Minutes of Exercise per Session: Not on file  Stress:   . Feeling of Stress : Not on file  Social Connections:   . Frequency of Communication with Friends and Family: Not on file  . Frequency of Social Gatherings with Friends and Family: Not on file  . Attends Religious Services: Not on file  . Active Member of Clubs or Organizations: Not on file  . Attends Archivist Meetings: Not on file  . Marital Status: Not on file  Intimate Partner Violence:   .  Fear of Current or Ex-Partner: Not on file  . Emotionally Abused: Not on file  . Physically Abused: Not on file  . Sexually Abused: Not on file    Allergies:  Allergies  Allergen Reactions  . Sulfa Antibiotics Shortness Of Breath and Other (See Comments)    "Skin turns red" per patient "Skin turns red" per patient    Medications: Prior to Admission medications   Medication Sig Start Date End Date Taking? Authorizing Provider  albuterol (VENTOLIN HFA) 108 (90 Base) MCG/ACT inhaler Inhale 2 puffs into the lungs every 6 (six) hours as needed for wheezing or shortness of breath. 12/20/18  Yes Scarboro, Coralee North, NP  omeprazole (PRILOSEC OTC) 20 MG tablet Take 1 tablet (20 mg total)  by mouth daily. 12/20/18  Yes Scarboro, Coralee North, NP  fluconazole (DIFLUCAN) 150 MG tablet Take 1 tablet (150 mg total) by mouth once for 1 dose. Can take additional dose three days later if symptoms persist 04/25/19 04/25/19  Vena Austria, MD    Physical Exam Vitals:  Vitals:   04/25/19 1550  BP: 110/60   Patient's last menstrual period was 04/04/2019.  General: NAD, well nourished, appears stated age HEENT: normocephalic, anicteric Pulmonary: No increased work of breathing Genitourinary:  External: Normal external female genitalia.  Normal urethral meatus, normal  Bartholin's and Skene's glands.    Vagina: Normal vaginal mucosa, no evidence of prolapse.    Rectal: deferred  Lymphatic: no evidence of inguinal lymphadenopathy Extremities: no edema, erythema, or tenderness Neurologic: Grossly intact Psychiatric: mood appropriate, affect full  Female chaperone present for pelvic  portions of the physical exam  Assessment: 29 y.o. G0P0000 vaginal candida  Plan: Problem List Items Addressed This Visit    None    Visit Diagnoses    Vaginal candida    -  Primary   Relevant Medications   fluconazole (DIFLUCAN) 150 MG tablet   Other Relevant Orders   NuSwab BV and Candida, NAA      1) Risk factors for bacterial vaginosis and candida infections discussed.  We discussed normal vaginal flora/microbiome.  Any factors that may alter the microbiome increase the risk of these opportunistic infections.  These include changes in pH, antibiotic exposures, diabetes, wet bathing suits etc.  We discussed that treatment is aimed at eradicating abnormal bacterial overgrowth and or yeast.  There may be some role for vaginal probiotics in restoring normal vaginal flora.   - nuswab sent - rx diflucan - discussed hylafem if continued symptoms around menses  Vena Austria, MD, [facility], [facility] 04/25/2019, 4:11 PM

## 2019-04-29 LAB — NUSWAB BV AND CANDIDA, NAA
Candida albicans, NAA: POSITIVE — AB
Candida glabrata, NAA: NEGATIVE

## 2019-06-04 ENCOUNTER — Ambulatory Visit: Payer: Self-pay

## 2019-06-13 ENCOUNTER — Other Ambulatory Visit: Payer: Self-pay | Admitting: Obstetrics and Gynecology

## 2019-06-14 ENCOUNTER — Other Ambulatory Visit: Payer: Self-pay | Admitting: Obstetrics and Gynecology

## 2019-06-14 MED ORDER — FLUCONAZOLE 150 MG PO TABS
150.0000 mg | ORAL_TABLET | Freq: Once | ORAL | 0 refills | Status: AC
Start: 2019-06-14 — End: 2019-06-14

## 2019-06-14 NOTE — Telephone Encounter (Signed)
Got msg on triage line. Called pt to let her know msg received. She is currently having vaginal itching and some discharge. Denies vaginal odor.

## 2019-12-11 ENCOUNTER — Other Ambulatory Visit: Payer: Self-pay | Admitting: Physical Medicine & Rehabilitation

## 2019-12-11 DIAGNOSIS — M5442 Lumbago with sciatica, left side: Secondary | ICD-10-CM

## 2020-01-03 ENCOUNTER — Other Ambulatory Visit: Payer: Self-pay

## 2020-01-03 ENCOUNTER — Ambulatory Visit
Admission: RE | Admit: 2020-01-03 | Discharge: 2020-01-03 | Disposition: A | Discharge status: Home / Self Care | Payer: 59 | Source: Ambulatory Visit | Attending: Physical Medicine & Rehabilitation | Admitting: Physical Medicine & Rehabilitation

## 2020-01-03 DIAGNOSIS — M5442 Lumbago with sciatica, left side: Secondary | ICD-10-CM

## 2020-01-19 DIAGNOSIS — M48061 Spinal stenosis, lumbar region without neurogenic claudication: Secondary | ICD-10-CM | POA: Insufficient documentation

## 2020-02-17 ENCOUNTER — Encounter: Payer: Self-pay | Admitting: Family Medicine

## 2020-02-17 ENCOUNTER — Other Ambulatory Visit: Payer: Self-pay

## 2020-02-17 ENCOUNTER — Ambulatory Visit
Admission: EM | Admit: 2020-02-17 | Discharge: 2020-02-17 | Disposition: A | Discharge status: Home / Self Care | Payer: 59 | Attending: Family Medicine | Admitting: Family Medicine

## 2020-02-17 DIAGNOSIS — S29012A Strain of muscle and tendon of back wall of thorax, initial encounter: Secondary | ICD-10-CM

## 2020-02-17 MED ORDER — HYDROCODONE-ACETAMINOPHEN 5-325 MG PO TABS: 1.0000 | ORAL_TABLET | Freq: Four times a day (QID) | ORAL | 0 refills | Status: DC | PRN
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
## 2020-02-17 NOTE — ED Triage Notes (Addendum)
Pt is here after a fall that happened after falling off a step stool tomorrow, pt is having shoulder/back pain and has taken Meloxicam to relieve discomfort.

## 2020-02-17 NOTE — Discharge Instructions (Signed)
MRI LUMBAR SPINE WITHOUT CONTRAST   TECHNIQUE: Multiplanar, multisequence MR imaging of the lumbar spine was performed. No intravenous contrast was administered.   COMPARISON:  None.   FINDINGS: Segmentation:  Standard.   Alignment: Levocurvature of the upper lumbar spine. Otherwise, physiologic.   Vertebrae: Vertebral body heights are maintained. No focal abnormal marrow signal to suggest acute fracture, osteomyelitis, or suspicious primary bone lesion.   Conus medullaris and cauda equina: Conus extends to the L1-L2 level. Conus and cauda equina appear normal.   Paraspinal and other soft tissues: Negative.   Disc levels:   T12-L1: No significant disc protrusion, foraminal stenosis, or canal stenosis.   L1-L2: No significant disc protrusion, foraminal stenosis, or canal stenosis.   L2-L3: No significant disc protrusion, foraminal stenosis, or canal stenosis.   L3-L4: Mild left-sided facet hypertrophy. No significant disc protrusion, foraminal stenosis, or canal stenosis.   L4-L5: Mild bilateral facet hypertrophy. No significant disc protrusion, foraminal stenosis, or canal stenosis.   L5-S1: No significant disc protrusion, foraminal stenosis, or canal stenosis.   IMPRESSION: 1. No significant canal or foraminal stenosis. 2. Upper lumbar levocurvature.     Electronically Signed   By: Feliberto Harts MD   On: 01/03/2020 10:10   Result History  MR LUMBAR SPINE WO CONTRAST (Order #[id number]) on 01/03/2020 - Order Result History Report MyChart Results Release  MyChart Status: Active  Results Release Encounter-Level Documents - 01/03/2020:  Electronic signature on 01/03/2020 7:13 AM - E-signed Scan on 01/03/2020 7:30 AM by Default, Provider, MD Scan on 12/12/2019 12:22 PM by Default, Provider, MD

## 2020-04-11 ENCOUNTER — Other Ambulatory Visit: Payer: Self-pay

## 2020-04-11 ENCOUNTER — Ambulatory Visit
Admission: EM | Admit: 2020-04-11 | Discharge: 2020-04-11 | Disposition: A | Discharge status: Home / Self Care | Payer: 59 | Attending: Emergency Medicine | Admitting: Emergency Medicine

## 2020-04-11 DIAGNOSIS — J069 Acute upper respiratory infection, unspecified: Secondary | ICD-10-CM | POA: Diagnosis not present

## 2020-04-11 DIAGNOSIS — R059 Cough, unspecified: Secondary | ICD-10-CM | POA: Diagnosis not present

## 2020-04-11 MED ORDER — ALBUTEROL SULFATE HFA 108 (90 BASE) MCG/ACT IN AERS: 1.0000 | INHALATION_SPRAY | Freq: Four times a day (QID) | RESPIRATORY_TRACT | 0 refills | Status: DC | PRN

## 2020-04-11 MED ORDER — BENZONATATE 100 MG PO CAPS: 200.0000 mg | ORAL_CAPSULE | Freq: Three times a day (TID) | ORAL | 0 refills | Status: DC

## 2020-04-11 MED ORDER — CETIRIZINE HCL 10 MG PO CAPS: 10.0000 mg | ORAL_CAPSULE | Freq: Every day | ORAL | 0 refills | Status: DC

## 2020-04-11 NOTE — ED Provider Notes (Signed)
[name]    CSN: [id number] Arrival date & time: 04/11/20  1526      History   Chief Complaint Chief Complaint  Patient presents with  . Facial Pain    pressure  . Cough    HPI Brenda Knight is a 30 y.o. female presenting today for evaluation of URI symptoms.  Reports sinus pressure and congestion for the past 2 days.  Using over-the-counter Mucinex and inhaler.  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  She has had some occasional shortness of breath and using albuterol inhaler as needed with some relief.  Denies any fevers chills or body aches.  HPI  Past Medical History:  Diagnosis Date  . Asthma   . C. difficile colitis 2008  . Endometriosis     Patient Active Problem List   Diagnosis Date Noted  . Breast cyst 10/04/2012  . Mastodynia 09/28/2012  . Fibrocystic change of breast 09/28/2012    Past Surgical History:  Procedure Laterality Date  . 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 COLPOSCOPY W/ BIOPSY / CURETTAGE  01/2016, 2014   CIN 2-3 (2017)  . LEEP  03/19/2016   CIN 1  . PELVIC LAPAROSCOPY  2014   endometriosis    OB History    Gravida  0   Para  0   Term  0   Preterm  0   AB  0   Living  0     SAB  0   IAB  0   Ectopic  0   Multiple  0   Live Births  0        Obstetric Comments  1st Menstrual Cycle: 13 1st Pregnancy: NA         Home Medications    Prior to Admission medications   Medication Sig Start Date End Date Taking? Authorizing Provider  albuterol (VENTOLIN HFA) 108 (90 Base) MCG/ACT inhaler Inhale 1-2 puffs into the lungs every 6 (six) hours as needed for wheezing or shortness of breath. 04/11/20  Yes Indonesia Mckeough C, PA-C  benzonatate (TESSALON) 100 MG capsule Take 2 capsules (200 mg total) by mouth every 8 (eight) hours. 04/11/20  Yes Kyana Aicher C, PA-C  Cetirizine HCl 10 MG CAPS Take 1 capsule (10 mg total) by mouth daily for 10 days.  04/11/20 04/21/20 Yes Evaluna Utke C, PA-C  HYDROcodone-acetaminophen (NORCO) 5-325 MG tablet Take 1 tablet by mouth every 6 (six) hours as needed for moderate pain. 02/17/20   Elvina Sidle, MD  omeprazole (PRILOSEC OTC) 20 MG tablet Take 1 tablet (20 mg total) by mouth daily. 12/20/18   Johnna Acosta, NP    Family History Family History  Problem Relation Age of Onset  . Asthma Mother   . Diabetes Father   . Cancer Maternal Aunt 47       breast    Social History Social History   Tobacco Use  . Smoking status: Former Smoker    Years: 1.00    Types: Cigarettes  . Smokeless tobacco: Never Used  Vaping Use  . Vaping Use: Never used  Substance Use Topics  . Alcohol use: Yes  . Drug use: No     Allergies   Sulfa antibiotics   Review of Systems Review of Systems  Constitutional: Negative for activity change, appetite change, chills, fatigue and fever.  HENT: Positive for congestion, rhinorrhea, sinus pressure and sore throat. Negative for  ear pain and trouble swallowing.   Eyes: Negative for discharge and redness.  Respiratory: Positive for cough. Negative for chest tightness and shortness of breath.   Cardiovascular: Negative for chest pain.  Gastrointestinal: Negative for abdominal pain, diarrhea, nausea and vomiting.  Musculoskeletal: Negative for myalgias.  Skin: Negative for rash.  Neurological: Negative for dizziness, light-headedness and headaches.     Physical Exam Triage Vital Signs ED Triage Vitals  Enc Vitals Group     BP 04/11/20 1535 110/75     Pulse Rate 04/11/20 1535 79     Resp 04/11/20 1535 15     Temp 04/11/20 1535 98 F (36.7 C)     Temp Source 04/11/20 1535 Oral     SpO2 04/11/20 1535 98 %     Weight 04/11/20 1538 114 lb (51.7 kg)     Height 04/11/20 1538 5\' 2"  (1.575 m)     Head Circumference --      Peak Flow --      Pain Score 04/11/20 1538 0     Pain Loc --      Pain Edu? --      Excl. in GC? --    No data found.  Updated  Vital Signs BP 110/75 (BP Location: Left Arm)   Pulse 79   Temp 98 F (36.7 C) (Oral)   Resp 15   Ht 5\' 2"  (1.575 m)   Wt 114 lb (51.7 kg)   LMP 02/26/2020 (Approximate)   SpO2 98%   BMI 20.85 kg/m   Visual Acuity Right Eye Distance:   Left Eye Distance:   Bilateral Distance:    Right Eye Near:   Left Eye Near:    Bilateral Near:     Physical Exam Vitals and nursing note reviewed.  Constitutional:      Appearance: She is well-developed and well-nourished.     Comments: No acute distress  HENT:     Head: Normocephalic and atraumatic.     Ears:     Comments: Bilateral ears without tenderness to palpation of external auricle, tragus and mastoid, EAC's without erythema or swelling, TM's with good bony landmarks and cone of light. Non erythematous.  Bilateral clear effusions     Nose: Nose normal.     Mouth/Throat:     Comments: Oral mucosa pink and moist, no tonsillar enlargement or exudate. Posterior pharynx patent and nonerythematous, no uvula deviation or swelling. Normal phonation. Eyes:     Conjunctiva/sclera: Conjunctivae normal.  Cardiovascular:     Rate and Rhythm: Normal rate and regular rhythm.  Pulmonary:     Effort: Pulmonary effort is normal. No respiratory distress.     Comments: Breathing comfortably at rest, CTABL, no wheezing, rales or other adventitious sounds auscultated Abdominal:     General: There is no distension.  Musculoskeletal:        General: Normal range of motion.     Cervical back: Neck supple.  Skin:    General: Skin is warm and dry.  Neurological:     Mental Status: She is alert and oriented to person, place, and time.  Psychiatric:        Mood and Affect: Mood and affect normal.      UC Treatments / Results  Labs (all labs ordered are listed, but only abnormal results are displayed) Labs Reviewed  NOVEL CORONAVIRUS, NAA    EKG   Radiology No results found.  Procedures Procedures (including critical care  time)  Medications Ordered in UC  Medications - No data to display  Initial Impression / Assessment and Plan / UC Course  I have reviewed the triage vital signs and the nursing notes.  Pertinent labs & imaging results that were available during my care of the patient were reviewed by me and considered in my medical decision making (see chart for details).     Viral URI with cough-COVID test pending, symptoms x2 days, recommending continued symptomatic and supportive care rest and fluids.  Exam reassuring.  Continue to monitor,Discussed strict return precautions. Patient verbalized understanding and is agreeable with plan.  Final Clinical Impressions(s) / UC Diagnoses   Final diagnoses:  Cough  Viral URI with cough     Discharge Instructions     COVID test pending Continue Flonase, add in daily cetirizine May supplement with over-the-counter Sudafed or Mucinex Albuterol inhaler for shortness of breath and wheezing Tessalon every 8 hours for cough Rest and fluids Follow-up if not improving or worsening    ED Prescriptions    Medication Sig Dispense Auth. Provider   albuterol (VENTOLIN HFA) 108 (90 Base) MCG/ACT inhaler Inhale 1-2 puffs into the lungs every 6 (six) hours as needed for wheezing or shortness of breath. 18 g Tiernan Millikin C, PA-C   benzonatate (TESSALON) 100 MG capsule Take 2 capsules (200 mg total) by mouth every 8 (eight) hours. 21 capsule Shawnya Mayor C, PA-C   Cetirizine HCl 10 MG CAPS Take 1 capsule (10 mg total) by mouth daily for 10 days. 10 capsule Oreoluwa Aigner, [place] C, PA-C     PDMP not reviewed this encounter.   Lew Dawes, [place] 04/11/20 1656

## 2020-04-11 NOTE — Discharge Instructions (Signed)
COVID test pending Continue Flonase, add in daily cetirizine May supplement with over-the-counter Sudafed or Mucinex Albuterol inhaler for shortness of breath and wheezing Tessalon every 8 hours for cough Rest and fluids Follow-up if not improving or worsening

## 2020-04-11 NOTE — ED Notes (Signed)
Patient presents to Urgent Care with complaints of sinus pressure and congestion x2 days. Patient reports taking mucinex otc and her inhaler. Pt is vaccinated for covid and booster (this past Wednesday).

## 2020-04-13 LAB — NOVEL CORONAVIRUS, NAA: SARS-CoV-2, NAA: DETECTED — AB

## 2020-04-13 LAB — SARS-COV-2, NAA 2 DAY TAT

## 2020-04-15 ENCOUNTER — Telehealth: Payer: Self-pay

## 2020-04-15 NOTE — Telephone Encounter (Signed)
Called to discuss with patient about COVID-19 symptoms and the use of one of the available treatments for those with mild to moderate Covid symptoms and at a high risk of hospitalization.  Pt appears to qualify for outpatient treatment due to co-morbid conditions and/or a member of an at-risk group in accordance with the FDA Emergency Use Authorization.    Symptom onset: 04/09/20 Vaccinated: Unknown Booster? Unkown Immunocompromised? No Qualifiers: Asthma  Unable to reach pt - Left message with call back number [phone].  Esther Hardy

## 2020-05-31 ENCOUNTER — Ambulatory Visit
Admission: RE | Admit: 2020-05-31 | Discharge: 2020-05-31 | Disposition: A | Discharge status: Home / Self Care | Payer: 59 | Source: Ambulatory Visit | Attending: Urgent Care | Admitting: Urgent Care

## 2020-05-31 ENCOUNTER — Ambulatory Visit (INDEPENDENT_AMBULATORY_CARE_PROVIDER_SITE_OTHER): Payer: 59

## 2020-05-31 ENCOUNTER — Other Ambulatory Visit: Payer: Self-pay

## 2020-05-31 VITALS — BP 119/79 | HR 97 | Temp 98.5°F | Resp 16

## 2020-05-31 DIAGNOSIS — R079 Chest pain, unspecified: Secondary | ICD-10-CM

## 2020-05-31 DIAGNOSIS — Z8616 Personal history of COVID-19: Secondary | ICD-10-CM

## 2020-05-31 DIAGNOSIS — R0789 Other chest pain: Secondary | ICD-10-CM

## 2020-05-31 MED ORDER — NAPROXEN 375 MG PO TABS: 375.0000 mg | ORAL_TABLET | Freq: Two times a day (BID) | ORAL | 0 refills | Status: DC

## 2020-05-31 NOTE — ED Triage Notes (Signed)
Pt c/o chest pain for over a month, usually once a day. States sometimes its on the lt side then rt side or center. States had covd in January and never experienced this before. Denies CP at this time.

## 2020-05-31 NOTE — ED Provider Notes (Signed)
[facility]   MRN: [id number] DOB: 1990/04/08  Subjective:   Brenda Knight is a 30 y.o. female presenting for 1 month history of persistent chest pain. Has also felt palpitations intermittently. Symptoms have been on either side but mostly the left, have become very bothersome and are occurring daily.  Initially was diagnosed with Covid in January and had a cough but has since improved.    No current facility-administered medications for this encounter.  Current Outpatient Medications:  .  albuterol (VENTOLIN HFA) 108 (90 Base) MCG/ACT inhaler, Inhale 1-2 puffs into the lungs every 6 (six) hours as needed for wheezing or shortness of breath., Disp: 18 g, Rfl: 0 .  benzonatate (TESSALON) 100 MG capsule, Take 2 capsules (200 mg total) by mouth every 8 (eight) hours., Disp: 21 capsule, Rfl: 0 .  Cetirizine HCl 10 MG CAPS, Take 1 capsule (10 mg total) by mouth daily for 10 days., Disp: 10 capsule, Rfl: 0 .  omeprazole (PRILOSEC OTC) 20 MG tablet, Take 1 tablet (20 mg total) by mouth daily., Disp: 30 tablet, Rfl: 2   Allergies  Allergen Reactions  . Sulfa Antibiotics Shortness Of Breath, Other (See Comments), Hives and Rash    "Skin turns red" per patient "Skin turns red" per patient "Skin turns red" per patient "Skin turns red" per patient    Past Medical History:  Diagnosis Date  . Asthma   . C. difficile colitis 2008  . Endometriosis      Past Surgical History:  Procedure Laterality Date  . 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 COLPOSCOPY W/ BIOPSY / CURETTAGE  01/2016, 2014   CIN 2-3 (2017)  . LEEP  03/19/2016   CIN 1  . PELVIC LAPAROSCOPY  2014   endometriosis    Family History  Problem Relation Age of Onset  . Asthma Mother   . Diabetes Father   . Cancer Maternal Aunt 40       breast    Social History   Tobacco Use  . Smoking status: Former Smoker    Years: 1.00    Types: Cigarettes  . Smokeless tobacco: Never Used  Vaping Use  .  Vaping Use: Never used  Substance Use Topics  . Alcohol use: Yes  . Drug use: No    ROS   Objective:   Vitals: BP 119/79 (BP Location: Left Arm)   Pulse 97   Temp 98.5 F (36.9 C) (Oral)   Resp 16   SpO2 98%   Physical Exam Constitutional:      General: She is not in acute distress.    Appearance: Normal appearance. She is well-developed. She is not ill-appearing, toxic-appearing or diaphoretic.  HENT:     Head: Normocephalic and atraumatic.     Nose: Nose normal.     Mouth/Throat:     Mouth: Mucous membranes are moist.  Eyes:     Extraocular Movements: Extraocular movements intact.     Pupils: Pupils are equal, round, and reactive to light.  Cardiovascular:     Rate and Rhythm: Normal rate and regular rhythm.     Pulses: Normal pulses.     Heart sounds: Normal heart sounds. No murmur heard. No friction rub. No gallop.   Pulmonary:     Effort: Pulmonary effort is normal. No respiratory distress.     Breath sounds: Normal breath sounds. No stridor. No wheezing, rhonchi or rales.  Skin:    General: Skin is warm and dry.  Findings: No rash.  Neurological:     Mental Status: She is alert and oriented to person, place, and time.  Psychiatric:        Mood and Affect: Mood normal.        Behavior: Behavior normal.        Thought Content: Thought content normal.     DG Chest 2 View  Result Date: 05/31/2020 CLINICAL DATA:  Intermittent chest pain. EXAM: CHEST - 2 VIEW COMPARISON:  03/19/2017 FINDINGS: The heart size and mediastinal contours are within normal limits. There is no evidence of pulmonary edema, consolidation, pneumothorax, nodule or pleural fluid. Mild rightward convex curvature of the thoracic spine. IMPRESSION: No active cardiopulmonary disease. Electronically Signed   By: Irish Lack M.D.   On: 05/31/2020 13:22     ED ECG REPORT   Date: 05/31/2020  EKG Time: 12:52 PM  Rate: 90bpm  Rhythm: normal sinus rhythm,  there are no previous tracings  available for comparison, normal sinus rhythm  Axis: Normal  Intervals:none  ST&T Change: Nonspecific T wave flattening in lead aVL  Narrative Interpretation: Sinus rhythm at 90 bpm.  No previous EKG available for comparison.   Assessment and Plan :   PDMP not reviewed this encounter.  1. Chest pain, unspecified type   2. History of COVID-19     Reassuring cardiopulmonary exam, chest x-ray and EKG.  Recommended supportive care such as naproxen for pain and inflammation.  Follow-up and establish care with a new PCP.  Information provided to the patient for this. Counseled patient on potential for adverse effects with medications prescribed/recommended today, ER and return-to-clinic precautions discussed, patient verbalized understanding.    Wallis Bamberg, PA-C 05/31/20 1334

## 2020-08-05 ENCOUNTER — Ambulatory Visit: Payer: Self-pay | Admitting: Obstetrics and Gynecology

## 2020-08-05 NOTE — Progress Notes (Deleted)
Gynecology Annual Exam   PCP: Patient, No Pcp Per (Inactive)  Chief Complaint: No chief complaint on file.   History of Present Illness: Patient is a 30 y.o. G0P0000 presents for annual exam. The patient has no complaints today.   LMP: No LMP recorded. Average Interval: {Desc; regular/irreg:14544}, {numbers 22-35:14824} days Duration of flow: {numbers; 0-10:33138} days Heavy Menses: {yes/no:63} Clots: {yes/no:63} Intermenstrual Bleeding: {yes/no:63} Postcoital Bleeding: {yes/no:63} Dysmenorrhea: {yes/no:63}  The patient {sys sexually active:13135} sexually active. She currently uses {method:5051} for contraception. She {has/denies:315300} dyspareunia.  The patient {DOES_DOES ZMO:29476} perform self breast exams.  There {is/is no:19420} notable family history of breast or ovarian cancer in her family.  The patient wears seatbelts: {yes/no:63}.   The patient has regular exercise: {yes/no/not asked:9010}.    The patient {Blank single:19197::"reports","denies"} current symptoms of depression.    Review of Systems: ROS  Past Medical History:  Patient Active Problem List   Diagnosis Date Noted  . Breast cyst 10/04/2012  . Mastodynia 09/28/2012  . Fibrocystic change of breast 09/28/2012    Past Surgical History:  Past Surgical History:  Procedure Laterality Date  . 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 COLPOSCOPY W/ BIOPSY / CURETTAGE  01/2016, 2014   CIN 2-3 (2017)  . LEEP  03/19/2016   CIN 1  . PELVIC LAPAROSCOPY  2014   endometriosis    Gynecologic History:  No LMP recorded. Contraception: {method:5051} Last Pap: Results were: {Findings; lab pap smear results:16707::"NIL and HR HPV+","NIL and HR HPV negative"}   Obstetric History: G0P0000  Family History:  Family History  Problem Relation Age of Onset  . Asthma Mother   . Diabetes Father   . Cancer Maternal Aunt 33       breast    Social History:  Social History   Socioeconomic History  .  Marital status: Single    Spouse name: Not on file  . Number of children: Not on file  . Years of education: Not on file  . Highest education level: Not on file  Occupational History  . Not on file  Tobacco Use  . Smoking status: Former Smoker    Years: 1.00    Types: Cigarettes  . Smokeless tobacco: Never Used  Vaping Use  . Vaping Use: Never used  Substance and Sexual Activity  . Alcohol use: Yes  . Drug use: No  . Sexual activity: Yes    Birth control/protection: None  Other Topics Concern  . Not on file  Social History Narrative  . Not on file   Social Determinants of Health   Financial Resource Strain: Not on file  Food Insecurity: Not on file  Transportation Needs: Not on file  Physical Activity: Not on file  Stress: Not on file  Social Connections: Not on file  Intimate Partner Violence: Not on file    Allergies:  Allergies  Allergen Reactions  . Sulfa Antibiotics Shortness Of Breath, Other (See Comments), Hives and Rash    "Skin turns red" per patient "Skin turns red" per patient "Skin turns red" per patient "Skin turns red" per patient    Medications: Prior to Admission medications   Medication Sig Start Date End Date Taking? Authorizing Provider  albuterol (VENTOLIN HFA) 108 (90 Base) MCG/ACT inhaler Inhale 1-2 puffs into the lungs every 6 (six) hours as needed for wheezing or shortness of breath. 04/11/20   Wieters, Hallie C, PA-C  naproxen (NAPROSYN) 375 MG tablet Take 1 tablet (375 mg total) by  mouth 2 (two) times daily with a meal. 05/31/20   Wallis Bamberg, PA-C  omeprazole (PRILOSEC OTC) 20 MG tablet Take 1 tablet (20 mg total) by mouth daily. 12/20/18   Johnna Acosta, NP    Physical Exam Vitals: There were no vitals taken for this visit.  General: NAD HEENT: normocephalic, anicteric Thyroid: no enlargement, no palpable nodules Pulmonary: No increased work of breathing, CTAB Cardiovascular: RRR, distal pulses 2+ Breast: Breast symmetrical, no  tenderness, no palpable nodules or masses, no skin or nipple retraction present, no nipple discharge.  No axillary or supraclavicular lymphadenopathy. Abdomen: NABS, soft, non-tender, non-distended.  Umbilicus without lesions.  No hepatomegaly, splenomegaly or masses palpable. No evidence of hernia  Genitourinary:  External: Normal external female genitalia.  Normal urethral meatus, normal Bartholin's and Skene's glands.    Vagina: Normal vaginal mucosa, no evidence of prolapse.    Cervix: Grossly normal in appearance, no bleeding  Uterus: Non-enlarged, mobile, normal contour.  No CMT  Adnexa: ovaries non-enlarged, no adnexal masses  Rectal: deferred  Lymphatic: no evidence of inguinal lymphadenopathy Extremities: no edema, erythema, or tenderness Neurologic: Grossly intact Psychiatric: mood appropriate, affect full  Female chaperone present for pelvic and breast  portions of the physical exam    Assessment: 30 y.o. G0P0000 routine annual exam  Plan: Problem List Items Addressed This Visit   None   Visit Diagnoses    Encounter for gynecological examination without abnormal finding    -  Primary   Screening for malignant neoplasm of cervix       Breast screening          2) STI screening  {Blank single:19197::"was","was not"}offered and {Blank single:19197::"accepted","declined","therefore not obtained"}  2)  ASCCP guidelines and rational discussed.  Patient opts for {Blank single:19197::"every 5 years","every 3 years","yearly","discontinue age >65","discontinue secondary to prior hysterectomy"} screening interval  3) Contraception - the patient is currently using  {method:5051}.  She is {Blank single:19197::"happy with her current form of contraception and plans to continue","interested in changing to ***","interested in starting Contraception: ***","not currently in need of contraception secondary to being sterile","attempting to conceive in the near future"}  4) Routine  healthcare maintenance including cholesterol, diabetes screening discussed {Blank single:19197::"managed by PCP","Ordered today","To return fasting at a later date","Declines"}  5) No follow-ups on file.   Vena Austria, MD, [facility], [facility] 08/05/2020, 1:09 PM

## 2020-10-02 ENCOUNTER — Other Ambulatory Visit: Payer: Self-pay

## 2020-10-02 ENCOUNTER — Emergency Department: Payer: 59

## 2020-10-02 ENCOUNTER — Encounter: Payer: Self-pay | Admitting: Emergency Medicine

## 2020-10-02 ENCOUNTER — Emergency Department
Admission: EM | Admit: 2020-10-02 | Discharge: 2020-10-02 | Disposition: A | Discharge status: Home / Self Care | Payer: 59 | Attending: Emergency Medicine | Admitting: Emergency Medicine

## 2020-10-02 DIAGNOSIS — N201 Calculus of ureter: Secondary | ICD-10-CM

## 2020-10-02 DIAGNOSIS — Z87891 Personal history of nicotine dependence: Secondary | ICD-10-CM | POA: Insufficient documentation

## 2020-10-02 DIAGNOSIS — N132 Hydronephrosis with renal and ureteral calculous obstruction: Secondary | ICD-10-CM | POA: Insufficient documentation

## 2020-10-02 DIAGNOSIS — J45909 Unspecified asthma, uncomplicated: Secondary | ICD-10-CM | POA: Insufficient documentation

## 2020-10-02 DIAGNOSIS — R109 Unspecified abdominal pain: Secondary | ICD-10-CM | POA: Diagnosis present

## 2020-10-02 LAB — URINALYSIS, COMPLETE (UACMP) WITH MICROSCOPIC
Bilirubin Urine: NEGATIVE
Glucose, UA: NEGATIVE mg/dL
Ketones, ur: NEGATIVE mg/dL
Nitrite: NEGATIVE
Protein, ur: 100 mg/dL — AB
RBC / HPF: >50 RBC/hpf — ABNORMAL HIGH (ref 0–5)
Specific Gravity, Urine: 1.031 — ABNORMAL HIGH (ref 1.005–1.030)
pH: 6 (ref 5.0–8.0)

## 2020-10-02 LAB — CBC
HCT: 39.9 % (ref 36.0–46.0)
Hemoglobin: 13.5 g/dL (ref 12.0–15.0)
MCH: 29.8 pg (ref 26.0–34.0)
MCHC: 33.8 g/dL (ref 30.0–36.0)
MCV: 88.1 fL (ref 80.0–100.0)
Platelets: 349 10*3/uL (ref 150–400)
RBC: 4.53 MIL/uL (ref 3.87–5.11)
RDW: 12.3 % (ref 11.5–15.5)
WBC: 9 10*3/uL (ref 4.0–10.5)
nRBC: 0 % (ref 0.0–0.2)

## 2020-10-02 LAB — BASIC METABOLIC PANEL
Anion gap: 10 (ref 5–15)
BUN: 13 mg/dL (ref 6–20)
CO2: 24 mmol/L (ref 22–32)
Calcium: 9.1 mg/dL (ref 8.9–10.3)
Chloride: 102 mmol/L (ref 98–111)
Creatinine, Ser: 0.66 mg/dL (ref 0.44–1.00)
GFR, Estimated: >60 mL/min (ref >60)
Glucose, Bld: 98 mg/dL (ref 70–99)
Potassium: 3.8 mmol/L (ref 3.5–5.1)
Sodium: 136 mmol/L (ref 135–145)

## 2020-10-02 LAB — POC URINE PREG, ED: Preg Test, Ur: NEGATIVE

## 2020-10-02 MED ORDER — OXYCODONE-ACETAMINOPHEN 5-325 MG PO TABS: 1.0000 | ORAL_TABLET | ORAL | 0 refills | Status: AC | PRN

## 2020-10-02 NOTE — ED Notes (Signed)
See triage note  Presents with left flank pain and left sided abd discomfort  States pain started about 12 n today

## 2020-10-02 NOTE — ED Triage Notes (Signed)
Pt comes into the ED via POV c/o left flank pain that started today.  PT denies any urinary problems or h/o kidney stones.  PT does admit to h/o ovarian cysts.  PT denies any N/V/D.  PT in NAD at this time with even and unlabored respirations and is ambulatory to triage.

## 2020-10-02 NOTE — ED Notes (Signed)
Patient transported to CT 

## 2020-10-02 NOTE — ED Provider Notes (Signed)
[facility] Emergency Department Provider Note ____________________________________________   Event Date/Time   First MD Initiated Contact with Patient 10/02/20 1635     (approximate)  I have reviewed the triage vital signs and the nursing notes.   HISTORY  Chief Complaint Flank Pain    HPI Brenda Knight is a 30 y.o. female with PMH as noted below who presents with left flank pain, acute onset this morning, radiating towards her left lower quadrant, and somewhat intermittent and colicky in nature.  She is not feeling the pain right now.  She has not had any associated nausea or vomiting, fever or chills, dysuria, hematuria, or other acute symptoms.  She denies any prior history of this pain.  Past Medical History:  Diagnosis Date   Asthma    C. difficile colitis 2008   Endometriosis     Patient Active Problem List   Diagnosis Date Noted   Breast cyst 10/04/2012   Mastodynia 09/28/2012   Fibrocystic change of breast 09/28/2012    Past Surgical History:  Procedure Laterality Date   BREAST BIOPSY Left 10-12-12   FIBROADENOMA   COLPOSCOPY W/ BIOPSY / CURETTAGE  01/2016, 2014   CIN 2-3 (2017)   LEEP  03/19/2016   CIN 1   PELVIC LAPAROSCOPY  2014   endometriosis    Prior to Admission medications   Medication Sig Start Date End Date Taking? Authorizing Provider  oxyCODONE-acetaminophen (PERCOCET) 5-325 MG tablet Take 1 tablet by mouth every 4 (four) hours as needed for up to 5 days for severe pain. 10/02/20 10/07/20 Yes Dionne Bucy, MD  albuterol (VENTOLIN HFA) 108 (90 Base) MCG/ACT inhaler Inhale 1-2 puffs into the lungs every 6 (six) hours as needed for wheezing or shortness of breath. 04/11/20   Wieters, Hallie C, PA-C  naproxen (NAPROSYN) 375 MG tablet Take 1 tablet (375 mg total) by mouth 2 (two) times daily with a meal. 05/31/20   Wallis Bamberg, PA-C  omeprazole (PRILOSEC OTC) 20 MG tablet Take 1 tablet (20 mg total) by mouth daily. 12/20/18    Johnna Acosta, NP    Allergies Sulfa antibiotics  Family History  Problem Relation Age of Onset   Asthma Mother    Diabetes Father    Cancer Maternal Aunt 65       breast    Social History Social History   Tobacco Use   Smoking status: Former    Years: 1.00    Pack years: 0.00    Types: Cigarettes   Smokeless tobacco: Never  Vaping Use   Vaping Use: Never used  Substance Use Topics   Alcohol use: Yes   Drug use: No    Review of Systems  Constitutional: No fever/chills Eyes: No visual changes. ENT: No sore throat. Cardiovascular: Denies chest pain. Respiratory: Denies shortness of breath. Gastrointestinal: No vomiting or diarrhea.  Genitourinary: Negative for dysuria.  Musculoskeletal: Positive for back pain. Skin: Negative for rash. Neurological: Negative for headaches, focal weakness or numbness.   ____________________________________________   PHYSICAL EXAM:  VITAL SIGNS: ED Triage Vitals  Enc Vitals Group     BP 10/02/20 1424 111/78     Pulse Rate 10/02/20 1424 82     Resp 10/02/20 1424 16     Temp 10/02/20 1424 98.4 F (36.9 C)     Temp Source 10/02/20 1424 Oral     SpO2 10/02/20 1424 100 %     Weight 10/02/20 1426 113 lb 15.7 oz (51.7 kg)  Height 10/02/20 1426 5\' 2"  (1.575 m)     Head Circumference --      Peak Flow --      Pain Score 10/02/20 1426 7     Pain Loc --      Pain Edu? --      Excl. in GC? --     Constitutional: Alert and oriented. Well appearing and in no acute distress. Eyes: Conjunctivae are normal.  Head: Atraumatic. Nose: No congestion/rhinnorhea. Mouth/Throat: Mucous membranes are moist.   Neck: Normal range of motion.  Cardiovascular: Normal rate, regular rhythm. Good peripheral circulation. Respiratory: Normal respiratory effort.  No retractions. Gastrointestinal: Soft and nontender. No distention.  Genitourinary: No CVA tenderness. Musculoskeletal: Extremities warm and well perfused.  Neurologic:  Normal  speech and language. No gross focal neurologic deficits are appreciated.  Skin:  Skin is warm and dry. No rash noted. Psychiatric: Mood and affect are normal. Speech and behavior are normal.  ____________________________________________   LABS (all labs ordered are listed, but only abnormal results are displayed)  Labs Reviewed  URINALYSIS, COMPLETE (UACMP) WITH MICROSCOPIC - Abnormal; Notable for the following components:      Result Value   Color, Urine YELLOW (*)    APPearance CLOUDY (*)    Specific Gravity, Urine 1.031 (*)    Hgb urine dipstick MODERATE (*)    Protein, ur 100 (*)    Leukocytes,Ua SMALL (*)    RBC / HPF >50 (*)    Bacteria, UA RARE (*)    Non Squamous Epithelial PRESENT (*)    All other components within normal limits  BASIC METABOLIC PANEL  CBC  POC URINE PREG, ED   ____________________________________________  EKG   ____________________________________________  RADIOLOGY  CT abdomen/pelvis: 6 mm distal left ureteral stone with moderate hydronephrosis  ____________________________________________   PROCEDURES  Procedure(s) performed: No  Procedures  Critical Care performed: No ____________________________________________   INITIAL IMPRESSION / ASSESSMENT AND PLAN / ED COURSE  Pertinent labs & imaging results that were available during my care of the patient were reviewed by me and considered in my medical decision making (see chart for details).   30 year old female with PMH as noted above presents with acute onset of left flank pain radiating towards her left lower quadrant with no significant associated symptoms.  On exam the patient is well-appearing.  Her vital signs are normal.  The physical exam is unremarkable.  She has no significant CVA tenderness or any abdominal tenderness.  Overall presentation is most consistent with ureteral stone.  Initial lab work-up is unremarkable except for urinalysis which shows significant RBCs but  only rare bacteria and no nitrites.  There is no clinical evidence for UTI/pyonephritis.  We will obtain a CT abdomen/pelvis to evaluate for kidney stone since this would be the first instance of a kidney stone for this patient.  ----------------------------------------- 5:42 PM on 10/02/2020 -----------------------------------------  CT confirms presence of a 6 mm stone in the distal left ureter.  However, the patient is not having any significant pain at this time.  Therefore she does not require urgent urology consultation or further intervention in the ED.  She is stable for discharge home with outpatient follow-up.  I counseled her on the results of the work-up.  I will prescribe Percocet for any recurrent pain and have provided urology referral.  Return precautions given, and the patient expresses understanding.  ____________________________________________   FINAL CLINICAL IMPRESSION(S) / ED DIAGNOSES  Final diagnoses:  Ureteral stone  NEW MEDICATIONS STARTED DURING THIS VISIT:  New Prescriptions   OXYCODONE-ACETAMINOPHEN (PERCOCET) 5-325 MG TABLET    Take 1 tablet by mouth every 4 (four) hours as needed for up to 5 days for severe pain.     Note:  This document was prepared using Dragon voice recognition software and may include unintentional dictation errors.    Dionne Bucy, MD 10/02/20 1743

## 2020-10-02 NOTE — Discharge Instructions (Addendum)
Make sure to drink plenty of fluids.  Take the Percocet only if needed for severe pain.  You may also take over-the-counter ibuprofen.  Make an appointment to follow-up with the urologist within the next week.  Return to the ER for new, worsening, or persistent severe pain, fever, vomiting, or any other new or worsening symptoms that concern you.

## 2020-10-10 ENCOUNTER — Ambulatory Visit (INDEPENDENT_AMBULATORY_CARE_PROVIDER_SITE_OTHER): Payer: 59 | Admitting: Urology

## 2020-10-10 ENCOUNTER — Encounter: Payer: Self-pay | Admitting: Urology

## 2020-10-10 ENCOUNTER — Ambulatory Visit
Admission: RE | Admit: 2020-10-10 | Discharge: 2020-10-10 | Disposition: A | Discharge status: Home / Self Care | Payer: 59 | Source: Ambulatory Visit | Attending: Urology | Admitting: Urology

## 2020-10-10 ENCOUNTER — Ambulatory Visit
Admission: RE | Admit: 2020-10-10 | Discharge: 2020-10-10 | Disposition: A | Discharge status: Home / Self Care | Payer: 59 | Attending: Urology | Admitting: Urology

## 2020-10-10 ENCOUNTER — Other Ambulatory Visit: Payer: Self-pay

## 2020-10-10 VITALS — BP 105/71 | HR 67 | Ht 62.0 in | Wt 114.0 lb

## 2020-10-10 DIAGNOSIS — N200 Calculus of kidney: Secondary | ICD-10-CM | POA: Diagnosis present

## 2020-10-10 LAB — URINALYSIS, COMPLETE
Bilirubin, UA: NEGATIVE
Glucose, UA: NEGATIVE
Ketones, UA: NEGATIVE
Leukocytes,UA: NEGATIVE
Nitrite, UA: NEGATIVE
Protein,UA: NEGATIVE
Specific Gravity, UA: 1.02 (ref 1.005–1.030)
Urobilinogen, Ur: 0.2 mg/dL (ref 0.2–1.0)
pH, UA: 7 (ref 5.0–7.5)

## 2020-10-10 LAB — MICROSCOPIC EXAMINATION
Bacteria, UA: NONE SEEN
WBC, UA: NONE SEEN /hpf (ref 0–5)

## 2020-10-10 NOTE — Progress Notes (Signed)
10/10/2020 11:14 AM   [name] [birth year] [id number]  Referring provider: No referring provider defined for this encounter.  Chief Complaint  Patient presents with   Nephrolithiasis    HPI: 30 year old female who presents today for follow-up of the left ureteral calculus.  She was seen and evaluated in the emergency room with acute left flank pain on 10/02/2020.  CT scan indicated a left 6 mm UVJ stone with proximal hydroureteronephrosis.  She had no signs or symptoms of infection her pain was able to be controlled.  She was ultimately discharged home with urologic follow-up.  She reports today that her severe pain subsided during that emergency room visit.  She continues to have intermittent nausea as well as some urinary frequency.  She is never seen the stone pass.  She denies any severe pain, fevers, chills, dysuria, gross hematuria.  No previous kidney stone episodes.   PMH: Past Medical History:  Diagnosis Date   Asthma    C. difficile colitis 2008   Endometriosis     Surgical History: Past Surgical History:  Procedure Laterality Date   BREAST BIOPSY Left 10-12-12   FIBROADENOMA   COLPOSCOPY W/ BIOPSY / CURETTAGE  01/2016, 2014   CIN 2-3 (2017)   LEEP  03/19/2016   CIN 1   PELVIC LAPAROSCOPY  2014   endometriosis    Home Medications:  Allergies as of 10/10/2020       Reactions   Sulfa Antibiotics Shortness Of Breath, Other (See Comments), Hives, Rash   "Skin turns red" per patient "Skin turns red" per patient "Skin turns red" per patient "Skin turns red" per patient        Medication List        Accurate as of October 10, 2020 11:14 AM. If you have any questions, ask your nurse or doctor.          STOP taking these medications    naproxen 375 MG tablet Commonly known as: NAPROSYN Stopped by: Vanna Scotland, MD   omeprazole 20 MG tablet Commonly known as: PriLOSEC OTC Stopped by: Vanna Scotland, MD       TAKE these medications     albuterol 108 (90 Base) MCG/ACT inhaler Commonly known as: VENTOLIN HFA Inhale 1-2 puffs into the lungs every 6 (six) hours as needed for wheezing or shortness of breath.        Allergies:  Allergies  Allergen Reactions   Sulfa Antibiotics Shortness Of Breath, Other (See Comments), Hives and Rash    "Skin turns red" per patient "Skin turns red" per patient "Skin turns red" per patient "Skin turns red" per patient    Family History: Family History  Problem Relation Age of Onset   Asthma Mother    Diabetes Father    Cancer Maternal Aunt 98       breast    Social History:  reports that she has quit smoking. Her smoking use included cigarettes. She has never used smokeless tobacco. She reports current alcohol use. She reports that she does not use drugs.   Physical Exam: BP 105/71   Pulse 67   Ht 5\' 2"  (1.575 m)   Wt 114 lb (51.7 kg)   LMP 09/13/2020 (Approximate) Comment: neg preg test 10/02/20  BMI 20.85 kg/m   Constitutional:  Alert and oriented, No acute distress. HEENT: [place] AT, moist mucus membranes.  Trachea midline, no masses. Cardiovascular: No clubbing, cyanosis, or edema. Skin: No rashes, bruises or suspicious lesions. Neurologic: Grossly intact, no  focal deficits, moving all 4 extremities. Psychiatric: Normal mood and affect.  Laboratory Data: Lab Results  Component Value Date   WBC 9.0 10/02/2020   HGB 13.5 10/02/2020   HCT 39.9 10/02/2020   MCV 88.1 10/02/2020   PLT 349 10/02/2020    Lab Results  Component Value Date   CREATININE 0.66 10/02/2020    Urinalysis Results for orders placed or performed in visit on 10/10/20  Microscopic Examination   Urine  Result Value Ref Range   WBC, UA None seen 0 - 5 /hpf   RBC 0-2 0 - 2 /hpf   Epithelial Cells (non renal) 0-10 0 - 10 /hpf   Crystals Present (A) N/A   Crystal Type Amorphous Sediment N/A   Bacteria, UA None seen None seen/Few  Urinalysis, Complete  Result Value Ref Range   Specific  Gravity, UA 1.020 1.005 - 1.030   pH, UA 7.0 5.0 - 7.5   Color, UA Yellow Yellow   Appearance Ur Hazy (A) Clear   Leukocytes,UA Negative Negative   Protein,UA Negative Negative/Trace   Glucose, UA Negative Negative   Ketones, UA Negative Negative   RBC, UA Trace (A) Negative   Bilirubin, UA Negative Negative   Urobilinogen, Ur 0.2 0.2 - 1.0 mg/dL   Nitrite, UA Negative Negative   Microscopic Examination See below:      Pertinent Imaging:  CT Renal Stone Study  Narrative CLINICAL DATA:  Left flank pain, onset today.  EXAM: CT ABDOMEN AND PELVIS WITHOUT CONTRAST  TECHNIQUE: Multidetector CT imaging of the abdomen and pelvis was performed following the standard protocol without IV contrast.  COMPARISON:  Contrast enhanced CT 07/05/2014  FINDINGS: Lower chest: No acute airspace disease or pleural effusion.  Hepatobiliary: No focal hepatic abnormality on this unenhanced exam. Gallbladder physiologically distended, no calcified stone. No biliary dilatation.  Pancreas: No ductal dilatation or inflammation.  Spleen: Normal in size without focal abnormality. Small splenule inferiorly.  Adrenals/Urinary Tract: No adrenal nodule. 6 mm obstructing stone in the distal left ureter just proximal to the ureterovesicular junction with moderate hydroureteronephrosis. Multiple additional calcifications in the pelvis represent phleboliths outside the ureter. No additional intrarenal calculi. No right renal calculi. No right hydronephrosis. Right ureter is decompressed. Partially distended urinary bladder.  Stomach/Bowel: Stomach is within normal limits. Appendix appears normal. No evidence of bowel wall thickening, distention, or inflammatory changes.  Vascular/Lymphatic: Normal caliber abdominal aorta. No portal venous or mesenteric gas. No bulky abdominopelvic adenopathy.  Reproductive: Uterus and bilateral adnexa are unremarkable.  Other: No free air or ascites. Tiny fat  containing umbilical hernia.  Musculoskeletal: There are no acute or suspicious osseous abnormalities.  IMPRESSION: Obstructing 6 mm stone in the distal left ureter with moderate hydronephrosis.   Electronically Signed By: Narda Rutherford M.D. On: 10/02/2020 17:21  CT scan was personally reviewed today.  Agree with radiologist rotation.  Assessment & Plan:    1.  Left ureteral calculus 6 mm left UVJ stone, unclear from her symptoms whether or not she has passed this.  UA is negative which is reassuring.  KUB today for baseline to assess for interval passage, advised her to continue straining urine  We discussed various treatment options for urolithiasis including observation with or without medical expulsive therapy, shockwave lithotripsy (SWL), ureteroscopy and laser lithotripsy with stent placement, and percutaneous nephrolithotomy.   We discussed that management is based on stone size, location, density, patient co-morbidities, and patient preference.    Stones <2mm in size have a >80%  spontaneous passage rate. Data surrounding the use of tamsulosin for medical expulsive therapy is controversial, but meta analyses suggests it is most efficacious for distal stones between 5-44mm in size. Possible side effects include dizziness/lightheadedness, and retrograde ejaculation.   SWL has a lower stone free rate in a single procedure, but also a lower complication rate compared to ureteroscopy and avoids a stent and associated stent related symptoms. Possible complications include renal hematoma, steinstrasse, and need for additional treatment. We discussed the role of his increased skin to stone distance can lead to decreased efficacy with shockwave lithotripsy.   Ureteroscopy with laser lithotripsy and stent placement has a higher stone free rate than SWL in a single procedure, however increased complication rate including possible infection, ureteral injury, bleeding, and stent related  morbidity. Common stent related symptoms include dysuria, urgency/frequency, and flank pain.   After an extensive discussion of the risks and benefits of the above treatment options, the patient would like to proceed with continue with surveillance for the next 2 weeks.  We will repeat her KUB in 2 weeks if she is not passed the stone.  If she does, she can cancel her follow-up as she has no other stones.  If she fails to pass her stone at the next follow-up, we will likely opt for ESWL.  Warning symptoms reviewed.  Indications for more urgent/emergent follow-up were discussed.  We did also go ahead and discuss today stone diet symptoms and she was given a stone book.  - Urinalysis, Complete  F/u 2 weeks with KUB  Vanna Scotland, MD  [facility] [address] [place], [place] [address] [phone]

## 2020-10-10 NOTE — H&P (View-Only) (Signed)
10/10/2020 11:14 AM   [name] [birth year] [id number]  Referring provider: No referring provider defined for this encounter.  Chief Complaint  Patient presents with   Nephrolithiasis    HPI: 30 year old female who presents today for follow-up of the left ureteral calculus.  She was seen and evaluated in the emergency room with acute left flank pain on 10/02/2020.  CT scan indicated a left 6 mm UVJ stone with proximal hydroureteronephrosis.  She had no signs or symptoms of infection her pain was able to be controlled.  She was ultimately discharged home with urologic follow-up.  She reports today that her severe pain subsided during that emergency room visit.  She continues to have intermittent nausea as well as some urinary frequency.  She is never seen the stone pass.  She denies any severe pain, fevers, chills, dysuria, gross hematuria.  No previous kidney stone episodes.   PMH: Past Medical History:  Diagnosis Date   Asthma    C. difficile colitis 2008   Endometriosis     Surgical History: Past Surgical History:  Procedure Laterality Date   BREAST BIOPSY Left 10-12-12   FIBROADENOMA   COLPOSCOPY W/ BIOPSY / CURETTAGE  01/2016, 2014   CIN 2-3 (2017)   LEEP  03/19/2016   CIN 1   PELVIC LAPAROSCOPY  2014   endometriosis    Home Medications:  Allergies as of 10/10/2020       Reactions   Sulfa Antibiotics Shortness Of Breath, Other (See Comments), Hives, Rash   "Skin turns red" per patient "Skin turns red" per patient "Skin turns red" per patient "Skin turns red" per patient        Medication List        Accurate as of October 10, 2020 11:14 AM. If you have any questions, ask your nurse or doctor.          STOP taking these medications    naproxen 375 MG tablet Commonly known as: NAPROSYN Stopped by: Vanna Scotland, MD   omeprazole 20 MG tablet Commonly known as: PriLOSEC OTC Stopped by: Vanna Scotland, MD       TAKE these medications     albuterol 108 (90 Base) MCG/ACT inhaler Commonly known as: VENTOLIN HFA Inhale 1-2 puffs into the lungs every 6 (six) hours as needed for wheezing or shortness of breath.        Allergies:  Allergies  Allergen Reactions   Sulfa Antibiotics Shortness Of Breath, Other (See Comments), Hives and Rash    "Skin turns red" per patient "Skin turns red" per patient "Skin turns red" per patient "Skin turns red" per patient    Family History: Family History  Problem Relation Age of Onset   Asthma Mother    Diabetes Father    Cancer Maternal Aunt 98       breast    Social History:  reports that she has quit smoking. Her smoking use included cigarettes. She has never used smokeless tobacco. She reports current alcohol use. She reports that she does not use drugs.   Physical Exam: BP 105/71   Pulse 67   Ht 5\' 2"  (1.575 m)   Wt 114 lb (51.7 kg)   LMP 09/13/2020 (Approximate) Comment: neg preg test 10/02/20  BMI 20.85 kg/m   Constitutional:  Alert and oriented, No acute distress. HEENT: [place] AT, moist mucus membranes.  Trachea midline, no masses. Cardiovascular: No clubbing, cyanosis, or edema. Skin: No rashes, bruises or suspicious lesions. Neurologic: Grossly intact, no  focal deficits, moving all 4 extremities. Psychiatric: Normal mood and affect.  Laboratory Data: Lab Results  Component Value Date   WBC 9.0 10/02/2020   HGB 13.5 10/02/2020   HCT 39.9 10/02/2020   MCV 88.1 10/02/2020   PLT 349 10/02/2020    Lab Results  Component Value Date   CREATININE 0.66 10/02/2020    Urinalysis Results for orders placed or performed in visit on 10/10/20  Microscopic Examination   Urine  Result Value Ref Range   WBC, UA None seen 0 - 5 /hpf   RBC 0-2 0 - 2 /hpf   Epithelial Cells (non renal) 0-10 0 - 10 /hpf   Crystals Present (A) N/A   Crystal Type Amorphous Sediment N/A   Bacteria, UA None seen None seen/Few  Urinalysis, Complete  Result Value Ref Range   Specific  Gravity, UA 1.020 1.005 - 1.030   pH, UA 7.0 5.0 - 7.5   Color, UA Yellow Yellow   Appearance Ur Hazy (A) Clear   Leukocytes,UA Negative Negative   Protein,UA Negative Negative/Trace   Glucose, UA Negative Negative   Ketones, UA Negative Negative   RBC, UA Trace (A) Negative   Bilirubin, UA Negative Negative   Urobilinogen, Ur 0.2 0.2 - 1.0 mg/dL   Nitrite, UA Negative Negative   Microscopic Examination See below:      Pertinent Imaging:  CT Renal Stone Study  Narrative CLINICAL DATA:  Left flank pain, onset today.  EXAM: CT ABDOMEN AND PELVIS WITHOUT CONTRAST  TECHNIQUE: Multidetector CT imaging of the abdomen and pelvis was performed following the standard protocol without IV contrast.  COMPARISON:  Contrast enhanced CT 07/05/2014  FINDINGS: Lower chest: No acute airspace disease or pleural effusion.  Hepatobiliary: No focal hepatic abnormality on this unenhanced exam. Gallbladder physiologically distended, no calcified stone. No biliary dilatation.  Pancreas: No ductal dilatation or inflammation.  Spleen: Normal in size without focal abnormality. Small splenule inferiorly.  Adrenals/Urinary Tract: No adrenal nodule. 6 mm obstructing stone in the distal left ureter just proximal to the ureterovesicular junction with moderate hydroureteronephrosis. Multiple additional calcifications in the pelvis represent phleboliths outside the ureter. No additional intrarenal calculi. No right renal calculi. No right hydronephrosis. Right ureter is decompressed. Partially distended urinary bladder.  Stomach/Bowel: Stomach is within normal limits. Appendix appears normal. No evidence of bowel wall thickening, distention, or inflammatory changes.  Vascular/Lymphatic: Normal caliber abdominal aorta. No portal venous or mesenteric gas. No bulky abdominopelvic adenopathy.  Reproductive: Uterus and bilateral adnexa are unremarkable.  Other: No free air or ascites. Tiny fat  containing umbilical hernia.  Musculoskeletal: There are no acute or suspicious osseous abnormalities.  IMPRESSION: Obstructing 6 mm stone in the distal left ureter with moderate hydronephrosis.   Electronically Signed By: Narda Rutherford M.D. On: 10/02/2020 17:21  CT scan was personally reviewed today.  Agree with radiologist rotation.  Assessment & Plan:    1.  Left ureteral calculus 6 mm left UVJ stone, unclear from her symptoms whether or not she has passed this.  UA is negative which is reassuring.  KUB today for baseline to assess for interval passage, advised her to continue straining urine  We discussed various treatment options for urolithiasis including observation with or without medical expulsive therapy, shockwave lithotripsy (SWL), ureteroscopy and laser lithotripsy with stent placement, and percutaneous nephrolithotomy.   We discussed that management is based on stone size, location, density, patient co-morbidities, and patient preference.    Stones <2mm in size have a >80%  spontaneous passage rate. Data surrounding the use of tamsulosin for medical expulsive therapy is controversial, but meta analyses suggests it is most efficacious for distal stones between 5-44mm in size. Possible side effects include dizziness/lightheadedness, and retrograde ejaculation.   SWL has a lower stone free rate in a single procedure, but also a lower complication rate compared to ureteroscopy and avoids a stent and associated stent related symptoms. Possible complications include renal hematoma, steinstrasse, and need for additional treatment. We discussed the role of his increased skin to stone distance can lead to decreased efficacy with shockwave lithotripsy.   Ureteroscopy with laser lithotripsy and stent placement has a higher stone free rate than SWL in a single procedure, however increased complication rate including possible infection, ureteral injury, bleeding, and stent related  morbidity. Common stent related symptoms include dysuria, urgency/frequency, and flank pain.   After an extensive discussion of the risks and benefits of the above treatment options, the patient would like to proceed with continue with surveillance for the next 2 weeks.  We will repeat her KUB in 2 weeks if she is not passed the stone.  If she does, she can cancel her follow-up as she has no other stones.  If she fails to pass her stone at the next follow-up, we will likely opt for ESWL.  Warning symptoms reviewed.  Indications for more urgent/emergent follow-up were discussed.  We did also go ahead and discuss today stone diet symptoms and she was given a stone book.  - Urinalysis, Complete  F/u 2 weeks with KUB  Vanna Scotland, MD  [facility] [address] [place], [place] [address] [phone]

## 2020-10-14 ENCOUNTER — Telehealth: Payer: 59 | Admitting: Physician Assistant

## 2020-10-14 DIAGNOSIS — N898 Other specified noninflammatory disorders of vagina: Secondary | ICD-10-CM

## 2020-10-14 MED ORDER — FLUCONAZOLE 150 MG PO TABS: 150.0000 mg | ORAL_TABLET | Freq: Once | ORAL | 0 refills | Status: AC

## 2020-10-14 NOTE — Progress Notes (Signed)
E-Visit for Vaginal Symptoms  We are sorry that you are not feeling well. Here is how we plan to help! Based on what you shared with me it looks like you: May have a yeast vaginosis  Vaginosis is an inflammation of the vagina that can result in discharge, itching and pain. The cause is usually a change in the normal balance of vaginal bacteria or an infection. Vaginosis can also result from reduced estrogen levels after menopause.  The most common causes of vaginosis are:   Bacterial vaginosis which results from an overgrowth of one on several organisms that are normally present in your vagina.   Yeast infections which are caused by a naturally occurring fungus called candida.   Vaginal atrophy (atrophic vaginosis) which results from the thinning of the vagina from reduced estrogen levels after menopause.   Trichomoniasis which is caused by a parasite and is commonly transmitted by sexual intercourse.  Factors that increase your risk of developing vaginosis include: Medications, such as antibiotics and steroids Uncontrolled diabetes Use of hygiene products such as bubble bath, vaginal spray or vaginal deodorant Douching Wearing damp or tight-fitting clothing Using an intrauterine device (IUD) for birth control Hormonal changes, such as those associated with pregnancy, birth control pills or menopause Sexual activity Having a sexually transmitted infection  Your treatment plan is A single Diflucan (fluconazole) 150mg  tablet once.  I have electronically sent this prescription into the pharmacy that you have chosen.  Be sure to take all of the medication as directed. Stop taking any medication if you develop a rash, tongue swelling or shortness of breath. Mothers who are breast feeding should consider pumping and discarding their breast milk while on these antibiotics. However, there is no consensus that infant exposure at these doses would be harmful.  Remember that medication creams can  weaken latex condoms. . The swollen labia could be a cyst. If it does not resolve on its own, or with the above treatment you may need to be seen in person. You can use warm compresses, and soaking in Epsom salt baths can help as well.   HOME CARE:  Good hygiene may prevent some types of vaginosis from recurring and may relieve some symptoms:  Avoid baths, hot tubs and whirlpool spas. Rinse soap from your outer genital area after a shower, and dry the area well to prevent irritation. Don't use scented or harsh soaps, such as those with deodorant or antibacterial action. Avoid irritants. These include scented tampons and pads. Wipe from front to back after using the toilet. Doing so avoids spreading fecal bacteria to your vagina.  Other things that may help prevent vaginosis include:  Don't douche. Your vagina doesn't require cleansing other than normal bathing. Repetitive douching disrupts the normal organisms that reside in the vagina and can actually increase your risk of vaginal infection. Douching won't clear up a vaginal infection. Use a latex condom. Both female and female latex condoms may help you avoid infections spread by sexual contact. Wear cotton underwear. Also wear pantyhose with a cotton crotch. If you feel comfortable without it, skip wearing underwear to bed. Yeast thrives in Your symptoms should improve in the next day or two.  GET HELP RIGHT AWAY IF:  You have pain in your lower abdomen ( pelvic area or over your ovaries) You develop nausea or vomiting You develop a fever Your discharge changes or worsens You have persistent pain with intercourse You develop shortness of breath, a rapid pulse, or you  faint.  These symptoms could be signs of problems or infections that need to be evaluated by a medical provider now.  MAKE SURE YOU   Understand these instructions. Will watch your condition. Will get help right away if you are not doing well or get  worse.  Thank you for choosing an e-visit.  Your e-visit answers were reviewed by a board certified advanced clinical practitioner to complete your personal care plan. Depending upon the condition, your plan could have included both over the counter or prescription medications.  Please review your pharmacy choice. Make sure the pharmacy is open so you can pick up prescription now. If there is a problem, you may contact your provider through [employer] and have the prescription routed to another pharmacy.  Your safety is important to [place]. If you have drug allergies check your prescription carefully.   For the next 24 hours you can use MyChart to ask questions about today's visit, request a non-urgent call back, or ask for a work or school excuse. You will get an email in the next two days asking about your experience. I hope that your e-visit has been valuable and will speed your recovery.  I provided 6 minutes of non face-to-face time during this encounter for chart review and documentation.

## 2020-10-18 ENCOUNTER — Ambulatory Visit: Payer: Self-pay | Admitting: *Deleted

## 2020-10-18 ENCOUNTER — Other Ambulatory Visit: Payer: Self-pay | Admitting: Urology

## 2020-10-18 ENCOUNTER — Other Ambulatory Visit: Payer: Self-pay

## 2020-10-18 ENCOUNTER — Emergency Department (HOSPITAL_COMMUNITY): Payer: 59

## 2020-10-18 ENCOUNTER — Emergency Department (HOSPITAL_COMMUNITY)
Admission: EM | Admit: 2020-10-18 | Discharge: 2020-10-18 | Disposition: A | Discharge status: Home / Self Care | Payer: 59 | Attending: Emergency Medicine | Admitting: Emergency Medicine

## 2020-10-18 ENCOUNTER — Telehealth: Payer: Self-pay | Admitting: Urology

## 2020-10-18 ENCOUNTER — Encounter (HOSPITAL_COMMUNITY): Payer: Self-pay

## 2020-10-18 DIAGNOSIS — N201 Calculus of ureter: Secondary | ICD-10-CM | POA: Insufficient documentation

## 2020-10-18 DIAGNOSIS — N200 Calculus of kidney: Secondary | ICD-10-CM

## 2020-10-18 DIAGNOSIS — Z87891 Personal history of nicotine dependence: Secondary | ICD-10-CM | POA: Diagnosis not present

## 2020-10-18 DIAGNOSIS — J45909 Unspecified asthma, uncomplicated: Secondary | ICD-10-CM | POA: Insufficient documentation

## 2020-10-18 DIAGNOSIS — R109 Unspecified abdominal pain: Secondary | ICD-10-CM | POA: Diagnosis present

## 2020-10-18 LAB — URINALYSIS, ROUTINE W REFLEX MICROSCOPIC
Bilirubin Urine: NEGATIVE
Glucose, UA: NEGATIVE mg/dL
Ketones, ur: 20 mg/dL — AB
Leukocytes,Ua: NEGATIVE
Nitrite: NEGATIVE
Protein, ur: NEGATIVE mg/dL
Specific Gravity, Urine: 1.012 (ref 1.005–1.030)
pH: 7 (ref 5.0–8.0)

## 2020-10-18 LAB — I-STAT BETA HCG BLOOD, ED (MC, WL, AP ONLY): I-stat hCG, quantitative: <5 m[IU]/mL (ref <5)

## 2020-10-18 MED ORDER — OXYCODONE-ACETAMINOPHEN 5-325 MG PO TABS: 1.0000 | ORAL_TABLET | ORAL | 0 refills | Status: DC | PRN

## 2020-10-18 MED ORDER — HYDROMORPHONE HCL 1 MG/ML IJ SOLN
0.5000 mg | Freq: Once | INTRAMUSCULAR | Status: AC
  Administered 2020-10-18: 0.5 mg via INTRAVENOUS
  Filled 2020-10-18: qty 1

## 2020-10-18 MED ORDER — OXYCODONE-ACETAMINOPHEN 5-325 MG PO TABS
1.0000 | ORAL_TABLET | Freq: Once | ORAL | Status: AC
  Administered 2020-10-18: 1 via ORAL
  Filled 2020-10-18: qty 1

## 2020-10-18 MED ORDER — ONDANSETRON 4 MG PO TBDP: 4.0000 mg | ORAL_TABLET | Freq: Three times a day (TID) | ORAL | 0 refills | Status: DC | PRN

## 2020-10-18 MED ORDER — ONDANSETRON 4 MG PO TBDP
4.0000 mg | ORAL_TABLET | Freq: Once | ORAL | Status: AC
  Administered 2020-10-18: 4 mg via ORAL
  Filled 2020-10-18: qty 1

## 2020-10-18 NOTE — Telephone Encounter (Signed)
Pt called in c/o flank pain on the community line.   Was seen in the ED earlier today and diagnosed with a kidney stone.   The oxycodone is not helping with the pain.    I advised her to return to the ED because that would be the only way to get stronger pain medication is via an IV since the oxycodone is a very strong narcotic she is taking.  "I'll see if it will ease up"   "Thank you anyway" and ended the call.

## 2020-10-18 NOTE — Telephone Encounter (Signed)
Spoke to patient, she is feeling better with the medication.   She has decided to wait through the weekend and give the office a call on Monday.  She declined a ureteroscopy with Dr. Apolinar Junes on Monday.  She is leaning towards lithotripsy possibly on Thursday.

## 2020-10-18 NOTE — ED Triage Notes (Signed)
Pt reports left flank pain increasing 2 hours prior to arrival. Previously dx with an obstructing kidney stone on 10/02/20.

## 2020-10-18 NOTE — Telephone Encounter (Signed)
Patient's SO, Jorge Ny [phone]) called the office this morning requesting an urgent appointment.  He stated that the patient was seen in the ER overnight for kidney stone and needed to be seen to discuss treatment options.    Patient does not have a completed DPR on file, so call was placed on speaker phone and I obtained verbal authorization to speak with Gaylyn Rong.  (Patient was in pain and vomiting.)  Discussed patient with Dr. Apolinar Junes.  She advised patient to pick up the medications prescribed by the ER for pain and nausea.  Patient was seen in the office on 7/14 for the same stone and was provided treatment options.  The patient elected surveillance.    Dr. Apolinar Junes offered ureteroscopy as soon as Monday afternoon or lithotripsy on Thursday.  SO will discuss with patient when she is feeling better and will call back to let us know.

## 2020-10-18 NOTE — Discharge Instructions (Addendum)
Follow up with your urologist for further management of the know kidney stone.   If you develop a fever, have uncontrolled vomiting or uncontrolled pain, return to the ED for further management.

## 2020-10-18 NOTE — Telephone Encounter (Signed)
Pt. Left message on my line stating she wants to schedule surgery for Monday.

## 2020-10-18 NOTE — ED Provider Notes (Signed)
[facility]EM[facility]GENCY [facility]PT Provider Note   CSN: [id number] Arrival date & time: 10/18/20  0340     History Chief Complaint  Patient presents with   Flank Pain    Brenda Knight is a 30 y.o. female.  Patient to ED with left sided abdominal pain with nausea and vomiting. No fever. She reports being diagnosed on 7/6 with a 6 mm Left sided stone by CT at [facility] ED. She had follow up on 7/14 with urology in [place] and reports that because her pain was managed they were observing to see if the stone would pass without intervention. She states her pain had been well controlled until tonight when it became severe.   The history is provided by the patient. No language interpreter was used.  Flank Pain Associated symptoms include abdominal pain.      Past Medical History:  Diagnosis Date   Asthma    C. difficile colitis 2008   Endometriosis     Patient Active Problem List   Diagnosis Date Noted   Breast cyst 10/04/2012   Mastodynia 09/28/2012   Fibrocystic change of breast 09/28/2012    Past Surgical History:  Procedure Laterality Date   BREAST BIOPSY Left 10-12-12   FIBROADENOMA   COLPOSCOPY W/ BIOPSY / CURETTAGE  01/2016, 2014   CIN 2-3 (2017)   LEEP  03/19/2016   CIN 1   PELVIC LAPAROSCOPY  2014   endometriosis     OB History     Gravida  0   Para  0   Term  0   Preterm  0   AB  0   Living  0      SAB  0   IAB  0   Ectopic  0   Multiple  0   Live Births  0        Obstetric Comments  1st Menstrual Cycle: 13 1st Pregnancy: NA         Family History  Problem Relation Age of Onset   Asthma Mother    Diabetes Father    Cancer Maternal Aunt 84       breast    Social History   Tobacco Use   Smoking status: Former    Years: 1.00    Types: Cigarettes   Smokeless tobacco: Never  Vaping Use   Vaping Use: Never used  Substance Use Topics   Alcohol use: Yes   Drug use: No    Home  Medications Prior to Admission medications   Medication Sig Start Date End Date Taking? Authorizing Provider  albuterol (VENTOLIN HFA) 108 (90 Base) MCG/ACT inhaler Inhale 1-2 puffs into the lungs every 6 (six) hours as needed for wheezing or shortness of breath. 04/11/20   Wieters, Hallie C, PA-C    Allergies    Sulfa antibiotics  Review of Systems   Review of Systems  Constitutional:  Negative for chills and fever.  HENT: Negative.    Respiratory: Negative.    Cardiovascular: Negative.   Gastrointestinal:  Positive for abdominal pain and vomiting.  Genitourinary:  Positive for flank pain.  Skin: Negative.   Neurological: Negative.    Physical Exam Updated Vital Signs BP 101/84 (BP Location: Left Arm)   Pulse 83   Temp 97.6 F (36.4 C) (Oral)   Resp 16   Ht 5\' 2"  (1.575 m)   Wt 51.7 kg   LMP 10/06/2020 Comment: neg preg test  SpO2 100%   BMI 20.85 kg/m  Physical Exam Constitutional:      General: She is not in acute distress.    Appearance: She is well-developed. She is not ill-appearing.  Pulmonary:     Effort: Pulmonary effort is normal.  Abdominal:     General: There is no distension.     Palpations: Abdomen is soft.     Tenderness: There is no abdominal tenderness. There is no left CVA tenderness.  Musculoskeletal:        General: Normal range of motion.     Cervical back: Normal range of motion.  Skin:    General: Skin is warm and dry.  Neurological:     Mental Status: She is alert and oriented to person, place, and time.    ED Results / Procedures / Treatments   Labs (all labs ordered are listed, but only abnormal results are displayed) Labs Reviewed  URINALYSIS, ROUTINE W REFLEX MICROSCOPIC - Abnormal; Notable for the following components:      Result Value   Color, Urine STRAW (*)    Hgb urine dipstick SMALL (*)    Ketones, ur 20 (*)    Bacteria, UA RARE (*)    All other components within normal limits  URINE CULTURE  I-STAT BETA HCG BLOOD, ED  ([facility], [facility], [facility] ONLY)   Results for orders placed or performed during the hospital encounter of 10/18/20  Urinalysis, Routine w reflex microscopic Urine, Clean Catch  Result Value Ref Range   Color, Urine STRAW (A) YELLOW   APPearance CLEAR CLEAR   Specific Gravity, Urine 1.012 1.005 - 1.030   pH 7.0 5.0 - 8.0   Glucose, UA NEGATIVE NEGATIVE mg/dL   Hgb urine dipstick SMALL (A) NEGATIVE   Bilirubin Urine NEGATIVE NEGATIVE   Ketones, ur 20 (A) NEGATIVE mg/dL   Protein, ur NEGATIVE NEGATIVE mg/dL   Nitrite NEGATIVE NEGATIVE   Leukocytes,Ua NEGATIVE NEGATIVE   RBC / HPF 0-5 0 - 5 RBC/hpf   WBC, UA 0-5 0 - 5 WBC/hpf   Bacteria, UA RARE (A) NONE SEEN   Squamous Epithelial / LPF 0-5 0 - 5   Mucus PRESENT   I-Stat beta hCG blood, ED  Result Value Ref Range   I-stat hCG, quantitative <5.0 <5 mIU/mL   Comment 3            EKG None  Radiology DG Abdomen 1 View  Result Date: 10/18/2020 CLINICAL DATA:  Left flank pain.  Known ureteral stone. EXAM: ABDOMEN - 1 VIEW COMPARISON:  X-ray from 10/10/2020 FINDINGS: The 5 mm elongated stone measured previously has migrated 15-20 mm in the interval based on positioning of adjacent phleboliths. Stone remains to the left of midline suggesting distal ureter/UVJ positioning. Nonspecific bowel gas pattern. IMPRESSION: 5 mm left ureteral/UVJ stone demonstrates some mild distal migration in the interval based on positioning of adjacent phleboliths. Electronically Signed   By: Kennith Center M.D.   On: 10/18/2020 05:03    Procedures Procedures   Medications Ordered in ED Medications  HYDROmorphone (DILAUDID) injection 0.5 mg (0.5 mg Intravenous Given 10/18/20 0436)    ED Course  I have reviewed the triage vital signs and the nursing notes.  Pertinent labs & imaging results that were available during my care of the patient were reviewed by me and considered in my medical decision making (see chart for details).    MDM Rules/Calculators/A&P  Patient to ED with Left sided abdominal pain as further described in the HPI.   No fever. Chart reviewed. KUB on 7/14 showed 5-6 mm stone initially seen on CT in about the same location. Notes from urology office follow up reflect wanting to wait to see if the stone would pass before considering intervention.   KUB repeated tonight shows interim migration of the stone to the distal ureter. This was discussed with the patient and follow up with urology was recommended to determine further course.   Final Clinical Impression(s) / ED Diagnoses Final diagnoses:  None   Left ureteral stone  Rx / DC Orders ED Discharge Orders     None        Danne Harbor 10/18/20 0349    Palumbo, April, MD 10/18/20 2318

## 2020-10-18 NOTE — Telephone Encounter (Signed)
Reason for Disposition  [1] SEVERE pain (e.g., excruciating, scale 8-10) AND [2] not improved after pain medicine    Seen in the ED earlier today and diagnosed with a kidney stone that is moving.  Answer Assessment - Initial Assessment Questions 1. LOCATION: "Where does it hurt?" (e.g., left, right)     See in ED today and diagnosed with a kidney stone.   It's moving.  They gave me oxycodone but it's not helping I'm still in a lot of pain.  2. ONSET: "When did the pain start?"     It was better but now it's getting worse and the oxycodone doesn't seem to be helping.   They told me the stone was moving. 3. SEVERITY: "How bad is the pain?" (e.g., Scale 1-10; mild, moderate, or severe)   - MILD (1-3): doesn't interfere with normal activities    - MODERATE (4-7): interferes with normal activities or awakens from sleep    - SEVERE (8-10): excruciating pain and patient unable to do normal activities (stays in bed)       10 4. PATTERN: "Does the pain come and go, or is it constant?"      constant 5. CAUSE: "What do you think is causing the pain?"     Diagnosed with a kidney stone in the ED today 6. OTHER SYMPTOMS:  "Do you have any other symptoms?" (e.g., fever, abdominal pain, vomiting, leg weakness, burning with urination, blood in urine)     Not asked since evaluated in the ED earlier today 7. PREGNANCY:  "Is there any chance you are pregnant?" "When was your last menstrual period?"     Not asked  Protocols used: Flank Pain-A-AH

## 2020-10-19 LAB — URINE CULTURE: Culture: NO GROWTH

## 2020-10-21 ENCOUNTER — Ambulatory Visit: Payer: 59 | Admitting: Certified Registered Nurse Anesthetist

## 2020-10-21 ENCOUNTER — Encounter
Admission: RE | Disposition: A | Discharge status: Home / Self Care | Payer: Self-pay | Source: Ambulatory Visit | Attending: Urology

## 2020-10-21 ENCOUNTER — Other Ambulatory Visit: Payer: Self-pay

## 2020-10-21 ENCOUNTER — Ambulatory Visit: Payer: 59

## 2020-10-21 ENCOUNTER — Encounter: Payer: Self-pay | Admitting: Urology

## 2020-10-21 ENCOUNTER — Ambulatory Visit
Admission: RE | Admit: 2020-10-21 | Discharge: 2020-10-21 | Disposition: A | Discharge status: Home / Self Care | Payer: 59 | Source: Ambulatory Visit | Attending: Urology | Admitting: Urology

## 2020-10-21 DIAGNOSIS — N132 Hydronephrosis with renal and ureteral calculous obstruction: Secondary | ICD-10-CM | POA: Insufficient documentation

## 2020-10-21 DIAGNOSIS — N201 Calculus of ureter: Secondary | ICD-10-CM | POA: Diagnosis not present

## 2020-10-21 DIAGNOSIS — Z87891 Personal history of nicotine dependence: Secondary | ICD-10-CM | POA: Insufficient documentation

## 2020-10-21 DIAGNOSIS — Z882 Allergy status to sulfonamides status: Secondary | ICD-10-CM | POA: Diagnosis not present

## 2020-10-21 DIAGNOSIS — N200 Calculus of kidney: Secondary | ICD-10-CM

## 2020-10-21 DIAGNOSIS — Z79899 Other long term (current) drug therapy: Secondary | ICD-10-CM | POA: Insufficient documentation

## 2020-10-21 DIAGNOSIS — Z791 Long term (current) use of non-steroidal anti-inflammatories (NSAID): Secondary | ICD-10-CM | POA: Diagnosis not present

## 2020-10-21 HISTORY — PX: CYSTOSCOPY/URETEROSCOPY/HOLMIUM LASER/STENT PLACEMENT: SHX6546

## 2020-10-21 LAB — POCT PREGNANCY, URINE: Preg Test, Ur: NEGATIVE

## 2020-10-21 SURGERY — CYSTOSCOPY/URETEROSCOPY/HOLMIUM LASER/STENT PLACEMENT
Anesthesia: General | Site: Ureter | Laterality: Left

## 2020-10-21 MED ORDER — PROPOFOL 500 MG/50ML IV EMUL
INTRAVENOUS | Status: DC | PRN
  Administered 2020-10-21: 190 ug/kg/min via INTRAVENOUS

## 2020-10-21 MED ORDER — GLYCOPYRROLATE 0.2 MG/ML IJ SOLN
INTRAMUSCULAR | Status: DC | PRN
  Administered 2020-10-21: .1 mg via INTRAVENOUS

## 2020-10-21 MED ORDER — FENTANYL CITRATE (PF) 100 MCG/2ML IJ SOLN: 25.0000 ug | INTRAMUSCULAR | Status: DC | PRN

## 2020-10-21 MED ORDER — MIDAZOLAM HCL 2 MG/2ML IJ SOLN
INTRAMUSCULAR | Status: AC
  Filled 2020-10-21: qty 2

## 2020-10-21 MED ORDER — LACTATED RINGERS IV SOLN: INTRAVENOUS | Status: DC

## 2020-10-21 MED ORDER — OXYCODONE HCL 5 MG PO TABS: 5.0000 mg | ORAL_TABLET | Freq: Once | ORAL | Status: DC | PRN

## 2020-10-21 MED ORDER — ORAL CARE MOUTH RINSE: 15.0000 mL | Freq: Once | OROMUCOSAL | Status: AC

## 2020-10-21 MED ORDER — 0.9 % SODIUM CHLORIDE (POUR BTL) OPTIME
TOPICAL | Status: DC | PRN
  Administered 2020-10-21: 10 mL

## 2020-10-21 MED ORDER — CEFAZOLIN SODIUM-DEXTROSE 2-4 GM/100ML-% IV SOLN
INTRAVENOUS | Status: AC
  Filled 2020-10-21: qty 100

## 2020-10-21 MED ORDER — OXYBUTYNIN CHLORIDE 5 MG PO TABS: 5.0000 mg | ORAL_TABLET | Freq: Three times a day (TID) | ORAL | 0 refills | Status: DC | PRN

## 2020-10-21 MED ORDER — FENTANYL CITRATE (PF) 100 MCG/2ML IJ SOLN
INTRAMUSCULAR | Status: AC
  Filled 2020-10-21: qty 2

## 2020-10-21 MED ORDER — PROPOFOL 10 MG/ML IV BOLUS
INTRAVENOUS | Status: DC | PRN
  Administered 2020-10-21: 10 mg via INTRAVENOUS
  Administered 2020-10-21: 60 mg via INTRAVENOUS

## 2020-10-21 MED ORDER — OXYCODONE HCL 5 MG/5ML PO SOLN: 5.0000 mg | Freq: Once | ORAL | Status: DC | PRN

## 2020-10-21 MED ORDER — IOPAMIDOL (ISOVUE-200) INJECTION 41%
INTRAVENOUS | Status: DC | PRN
  Administered 2020-10-21: 5 mL via INTRAVENOUS

## 2020-10-21 MED ORDER — PROPOFOL 10 MG/ML IV BOLUS
INTRAVENOUS | Status: AC
  Filled 2020-10-21: qty 40

## 2020-10-21 MED ORDER — PROMETHAZINE HCL 25 MG/ML IJ SOLN
6.2500 mg | INTRAMUSCULAR | Status: DC | PRN
Start: 2020-10-21 — End: 2020-10-21

## 2020-10-21 MED ORDER — CEFAZOLIN SODIUM-DEXTROSE 2-4 GM/100ML-% IV SOLN
2.0000 g | INTRAVENOUS | Status: AC
  Administered 2020-10-21: 2 g via INTRAVENOUS

## 2020-10-21 MED ORDER — ONDANSETRON HCL 4 MG/2ML IJ SOLN
INTRAMUSCULAR | Status: DC | PRN
  Administered 2020-10-21: 4 mg via INTRAVENOUS

## 2020-10-21 MED ORDER — FENTANYL CITRATE (PF) 100 MCG/2ML IJ SOLN
INTRAMUSCULAR | Status: DC | PRN
  Administered 2020-10-21 (×4): 25 ug via INTRAVENOUS

## 2020-10-21 MED ORDER — CHLORHEXIDINE GLUCONATE 0.12 % MT SOLN
15.0000 mL | Freq: Once | OROMUCOSAL | Status: AC
  Administered 2020-10-21: 15 mL via OROMUCOSAL

## 2020-10-21 MED ORDER — DEXAMETHASONE SODIUM PHOSPHATE 10 MG/ML IJ SOLN
INTRAMUSCULAR | Status: DC | PRN
  Administered 2020-10-21: 5 mg via INTRAVENOUS

## 2020-10-21 MED ORDER — MIDAZOLAM HCL 5 MG/5ML IJ SOLN
INTRAMUSCULAR | Status: DC | PRN
  Administered 2020-10-21: 2 mg via INTRAVENOUS

## 2020-10-21 SURGICAL SUPPLY — 31 items
ADHESIVE MASTISOL STRL (MISCELLANEOUS) ×2 IMPLANT
BAG DRAIN CYSTO-URO LG1000N (MISCELLANEOUS) ×2 IMPLANT
BASKET ZERO TIP 1.9FR (BASKET) ×2 IMPLANT
BRUSH SCRUB EZ 1% IODOPHOR (MISCELLANEOUS) ×2 IMPLANT
CATH URET FLEX-TIP 2 LUMEN 10F (CATHETERS) IMPLANT
CATH URETL 5X70 OPEN END (CATHETERS) ×2 IMPLANT
CNTNR SPEC 2.5X3XGRAD LEK (MISCELLANEOUS) ×1
CONT SPEC 4OZ STER OR WHT (MISCELLANEOUS) ×1
CONTAINER SPEC 2.5X3XGRAD LEK (MISCELLANEOUS) ×1 IMPLANT
DRAPE UTILITY 15X26 TOWEL STRL (DRAPES) ×2 IMPLANT
DRSG TEGADERM 2-3/8X2-3/4 SM (GAUZE/BANDAGES/DRESSINGS) ×2 IMPLANT
FIBER LASER MOSES 200 DFL (Laser) ×2 IMPLANT
GAUZE 4X4 16PLY ~~LOC~~+RFID DBL (SPONGE) ×4 IMPLANT
GLOVE SURG ENC MOIS LTX SZ6.5 (GLOVE) ×2 IMPLANT
GOWN STRL REUS W/ TWL LRG LVL3 (GOWN DISPOSABLE) ×2 IMPLANT
GOWN STRL REUS W/TWL LRG LVL3 (GOWN DISPOSABLE) ×2
GUIDEWIRE GREEN .038 145CM (MISCELLANEOUS) IMPLANT
GUIDEWIRE STR DUAL SENSOR (WIRE) ×2 IMPLANT
INFUSOR MANOMETER BAG 3000ML (MISCELLANEOUS) ×2 IMPLANT
IV NS IRRIG 3000ML ARTHROMATIC (IV SOLUTION) ×2 IMPLANT
KIT TURNOVER CYSTO (KITS) ×2 IMPLANT
MANIFOLD NEPTUNE II (INSTRUMENTS) IMPLANT
PACK CYSTO AR (MISCELLANEOUS) ×2 IMPLANT
SET CYSTO W/LG BORE CLAMP LF (SET/KITS/TRAYS/PACK) ×2 IMPLANT
SHEATH URETERAL 12FRX35CM (MISCELLANEOUS) IMPLANT
STENT URET 6FRX22 CONTOUR (STENTS) ×2 IMPLANT
STENT URET 6FRX24 CONTOUR (STENTS) IMPLANT
STENT URET 6FRX26 CONTOUR (STENTS) IMPLANT
SURGILUBE 2OZ TUBE FLIPTOP (MISCELLANEOUS) ×2 IMPLANT
TRACTIP FLEXIVA PULSE ID 200 (Laser) IMPLANT
WATER STERILE IRR 1000ML POUR (IV SOLUTION) ×2 IMPLANT

## 2020-10-21 NOTE — Interval H&P Note (Signed)
History and Physical Interval Note:  10/21/2020 9:33 AM  [name]  has presented today for surgery, with the diagnosis of kidney stones.  The various methods of treatment have been discussed with the patient and family. After consideration of risks, benefits and other options for treatment, the patient has consented to  Procedure(s): CYSTOSCOPY/URETEROSCOPY/HOLMIUM LASER/STENT PLACEMENT (Left) as a surgical intervention.  The patient's history has been reviewed, patient examined, no change in status, stable for surgery.  I have reviewed the patient's chart and labs.  Questions were answered to the patient's satisfaction.    RRR CTAB  Patient continues to have severe left lower quadrant and flank pain.  Interval emergency room visit without concern for infection, pain was able to be controlled and she was discharged.  She called the office and elected to undergo ureteroscopic intervention for stone today.  All questions answered.  Brenda Knight

## 2020-10-21 NOTE — OR Nursing (Signed)
Specimen was lost in transient to the lab. Specimen was small. Looked through back table drape and there was no specimen.

## 2020-10-21 NOTE — Transfer of Care (Signed)
Immediate Anesthesia Transfer of Care Note  Patient: Brenda Knight  Procedure(s) Performed: CYSTOSCOPY/URETEROSCOPY/HOLMIUM LASER/STENT PLACEMENT (Left: Ureter)  Patient Location: PACU  Anesthesia Type:General  Level of Consciousness: drowsy  Airway & Oxygen Therapy: Patient Spontanous Breathing and Patient connected to face mask oxygen  Post-op Assessment: Report given to RN  Post vital signs: Reviewed and stable  Last Vitals:  Vitals Value Taken Time  BP 97/67 10/21/20 1023  Temp    Pulse 67 10/21/20 1025  Resp 11 10/21/20 1025  SpO2 98 % 10/21/20 1025  Vitals shown include unvalidated device data.  Last Pain:  Vitals:   10/21/20 0925  TempSrc: Temporal  PainSc: 0-No pain         Complications: No notable events documented.

## 2020-10-21 NOTE — Discharge Instructions (Addendum)
You have a ureteral stent in place.  This is a tube that extends from your kidney to your bladder.  This may cause urinary bleeding, burning with urination, and urinary frequency.  Please call our office or present to the ED if you develop fevers >101 or pain which is not able to be controlled with oral pain medications.  You may be given either Flomax and/ or ditropan to help with bladder spasms and stent pain in addition to pain medications.    Your stent is on a string.  It is taped to your left inner thigh.  On Thursday morning, you may untaped this and remove the entire stent.  If you have any difficulty, please call our office.  If your stent is removed by accident prematurely, let us know during business hours.  [facility] [address] [place], [place] [address] [phone]  AMBULATORY SURGERY  DISCHARGE INSTRUCTIONS   The drugs that you were given will stay in your system until tomorrow so for the next 24 hours you should not:  Drive an automobile Make any legal decisions Drink any alcoholic beverage   You may resume regular meals tomorrow.  Today it is better to start with liquids and gradually work up to solid foods.  You may eat anything you prefer, but it is better to start with liquids, then soup and crackers, and gradually work up to solid foods.   Please notify your doctor immediately if you have any unusual bleeding, trouble breathing, redness and pain at the surgery site, drainage, fever, or pain not relieved by medication.    Additional Instructions:        Please contact your physician with any problems or Same Day Surgery at [phone], Monday through Friday 6 am to 4 pm, or [place] at [facility] number at [phone].

## 2020-10-21 NOTE — Anesthesia Preprocedure Evaluation (Addendum)
Anesthesia Evaluation  Patient identified by MRN, date of birth, ID band Patient awake    Reviewed: Allergy & Precautions, NPO status , Patient's Chart, lab work & pertinent test results  Airway Mallampati: II  TM Distance: >3 FB Neck ROM: full    Dental no notable dental hx.    Pulmonary neg pulmonary ROS, former smoker,    Pulmonary exam normal        Cardiovascular negative cardio ROS Normal cardiovascular exam     Neuro/Psych negative neurological ROS  negative psych ROS   GI/Hepatic negative GI ROS, Neg liver ROS,   Endo/Other  negative endocrine ROS  Renal/GU Kidney Stone     Musculoskeletal   Abdominal   Peds  Hematology negative hematology ROS (+)   Anesthesia Other Findings Past Medical History: No date: Asthma 2008: C. 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, 2014: COLPOSCOPY W/ BIOPSY / CURETTAGE     Comment:  CIN 2-3 (2017) 03/19/2016: LEEP     Comment:  CIN 1 2014: PELVIC LAPAROSCOPY     Comment:  endometriosis  BMI    Body Mass Index: 21.03 kg/m      Reproductive/Obstetrics negative OB ROS                             Anesthesia Physical Anesthesia Plan  ASA: 2  Anesthesia Plan: General   Post-op Pain Management:    Induction: Intravenous  PONV Risk Score and Plan: 3 and Ondansetron, Dexamethasone and Midazolam  Airway Management Planned: Nasal Cannula and Natural Airway  Additional Equipment:   Intra-op Plan:   Post-operative Plan: Extubation in OR  Informed Consent: I have reviewed the patients History and Physical, chart, labs and discussed the procedure including the risks, benefits and alternatives for the proposed anesthesia with the patient or authorized representative who has indicated his/her understanding and acceptance.     Dental Advisory  Given  Plan Discussed with: Anesthesiologist, CRNA and Surgeon  Anesthesia Plan Comments:        Anesthesia Quick Evaluation

## 2020-10-21 NOTE — Anesthesia Procedure Notes (Signed)
Procedure Name: General with mask airway Date/Time: 10/21/2020 9:52 AM Performed by: Chelsea Aus, CRNA Pre-anesthesia Checklist: Patient identified, Emergency Drugs available, Suction available and Patient being monitored Oxygen Delivery Method: Simple face mask Induction Type: IV induction

## 2020-10-21 NOTE — Op Note (Signed)
Date of procedure: 10/21/20  Preoperative diagnosis:  Left distal ureteral calculus  Postoperative diagnosis:  Same as above  Procedure: Left ureteroscopy laser lithotripsy Left ureteral stent placement Left retrograde pyelogram Basket extraction of stone fragment  Surgeon: Vanna Scotland, MD  Anesthesia: General  Complications: None  Intraoperative findings: 5 mm distal stone within fairly narrow ureter, obliterated and stent placed on tether.  EBL: Minimal  Specimens: Stone fragment  Drains: 6 x 22 French double-J ureteral stent on left  Indication: Brenda Knight is a 30 y.o. patient with symptomatic 5 mm left distal ureteral stone.  After reviewing the management options for treatment, she elected to proceed with the above surgical procedure(s). We have discussed the potential benefits and risks of the procedure, side effects of the proposed treatment, the likelihood of the patient achieving the goals of the procedure, and any potential problems that might occur during the procedure or recuperation. Informed consent has been obtained.  Description of procedure:  The patient was taken to the operating room and general anesthesia was induced.  The patient was placed in the dorsal lithotomy position, prepped and draped in the usual sterile fashion, and preoperative antibiotics were administered. A preoperative time-out was performed.   A 21 French scope was advanced per urethra into the bladder.  Attention was turned to the left ureteral orifice.  On scout imaging, the stone could be seen.  A gentle retrograde pyelogram was performed by injecting contrast through a 5 [place] open-ended ureteral catheter.  This showed a small filling defect with some very minimal hydronephrosis.  A wire was then placed up to the level of the kidney.  This was snapped in place as a safety wire.  A semirigid 4.5 French ureteroscope was advanced up to the level of the stone.  A 200 m fiber was used  using dusting settings of 0.3 J and 40 Hz to dust the stone into very small fragments.  If you have these fragments did E flux in a retrograde fashion is able to chase them up to the mid ureter and extracted using a basket.  Notably, the stone itself was in a very narrow portion of the ureter and was mildly embedded.  There is minimal edema or trauma residual however at this location on final cystoscopy.  All fragments removed.  No contrast extravasation was seen.  A stent was placed over the safety wire, 6 x 22 [place] and with the wire was withdrawn, there is a full coil both within the renal pelvis as well as within the bladder.  The stent string was left attached to the left stent and affixed to the patient's left inner thigh using Mastisol and Tegaderm.  She was then cleaned and dried, repositioned in the supine position, reversed from anesthesia, and taken to PACU in stable condition.  Plan: She remove her own stent on Thursday.  We will have her follow-up in 4 weeks with renal ultrasound prior.  Vanna Scotland, M.D.

## 2020-10-21 NOTE — Anesthesia Postprocedure Evaluation (Signed)
Anesthesia Post Note  Patient: SARAHANN HORRELL  Procedure(s) Performed: CYSTOSCOPY/URETEROSCOPY/HOLMIUM LASER/STENT PLACEMENT (Left: Ureter)  Patient location during evaluation: PACU Anesthesia Type: General Level of consciousness: awake and alert Pain management: pain level controlled Vital Signs Assessment: post-procedure vital signs reviewed and stable Respiratory status: spontaneous breathing, nonlabored ventilation and respiratory function stable Cardiovascular status: blood pressure returned to baseline and stable Postop Assessment: no apparent nausea or vomiting Anesthetic complications: no   No notable events documented.   Last Vitals:  Vitals:   10/21/20 1049 10/21/20 1105  BP:  112/80  Pulse:  63  Resp:  16  Temp: (!) 36.2 C (!) 36.3 C  SpO2:  100%    Last Pain:  Vitals:   10/21/20 1105  TempSrc: Temporal  PainSc: 2                  Foye Deer

## 2020-10-22 ENCOUNTER — Encounter: Payer: Self-pay | Admitting: Urology

## 2020-10-22 DIAGNOSIS — N132 Hydronephrosis with renal and ureteral calculous obstruction: Secondary | ICD-10-CM | POA: Diagnosis not present

## 2020-10-22 MED ORDER — SODIUM CHLORIDE 0.9 % IR SOLN
Status: DC | PRN
  Administered 2020-10-22: 1800 mL

## 2020-10-24 ENCOUNTER — Ambulatory Visit: Payer: 59 | Admitting: Physician Assistant

## 2020-10-24 ENCOUNTER — Telehealth: Payer: Self-pay

## 2020-10-24 DIAGNOSIS — N200 Calculus of kidney: Secondary | ICD-10-CM

## 2020-10-24 NOTE — Telephone Encounter (Signed)
Pt LM on triage line stating that she is having pain after removing stent this morning, she questions if this is normal.

## 2020-10-24 NOTE — Telephone Encounter (Signed)
Lm returning pt call regarding pain after self stent removal. I spoke with Michiel Cowboy PA. Post stent removal pain is normal. If pt is in severe pain we can call in toradol, if not pt can alternate tylenol and advil and take hot baths.

## 2020-10-24 NOTE — Telephone Encounter (Signed)
Incoming call from pt who states that she does not think she can remove her stent on her own. Pt states that the strings are visible and she can easily access them. Pt given verbal reassurance on how to remove stent. Offered to stay on the line as pt removed stent. Pt declined, states she will call back if needed.

## 2020-10-25 MED ORDER — KETOROLAC TROMETHAMINE 10 MG PO TABS
10.0000 mg | ORAL_TABLET | Freq: Four times a day (QID) | ORAL | 0 refills | Status: DC | PRN
Start: 2020-10-25 — End: 2020-11-27

## 2020-10-25 NOTE — Telephone Encounter (Signed)
Michiel Cowboy A, PA-C  You 11 minutes ago (11:46 AM)     We can send her in Toradol 10 mg, one tablet every 6 hours prn, # 5

## 2020-10-25 NOTE — Telephone Encounter (Signed)
Incoming call from pt who questions if it is normal to have post stent removal pain. Again reiterated the previous messages given to patient about post op pain being normal. Pt states pain is better today, however she would like an rx for pain medication to get through the weekend. Please advise.

## 2020-11-18 ENCOUNTER — Ambulatory Visit
Admission: RE | Admit: 2020-11-18 | Discharge: 2020-11-18 | Disposition: A | Discharge status: Home / Self Care | Payer: 59 | Source: Ambulatory Visit | Attending: Urology | Admitting: Urology

## 2020-11-18 ENCOUNTER — Other Ambulatory Visit: Payer: Self-pay

## 2020-11-18 DIAGNOSIS — N200 Calculus of kidney: Secondary | ICD-10-CM | POA: Insufficient documentation

## 2020-11-19 ENCOUNTER — Encounter: Payer: 59 | Admitting: Urology

## 2020-11-26 NOTE — Progress Notes (Signed)
11/29/20 8:26 AM   [name] 1991/02/10 [id number]  Referring provider:  No referring provider defined for this encounter. Chief Complaint  Patient presents with   Nephrolithiasis     HPI: Brenda Knight is a 30 y.o.female who returns today for 4 week follow-up for left ureteral calculus with RUS prior.   On 10/02/2020 she was seen and evaluated in the emergency room with acute left flank pain. CT scan indicated a left 6 mm UVJ stone with proximal hydroureteronephrosis.  She had no signs or symptoms of infection her pain was able to be controlled.     11/19/2020 RUS showed no acute or focal abnormality. No hydronephrosis noted on today's exam. Previously identified left hydronephrosis noted on 10/02/2020 has resolved.   She has had no previous stone episodes.   She is doing well today and experienced slight pain after sten removal that resolved on its own.  PMH: Past Medical History:  Diagnosis Date   Asthma    C. difficile colitis 2008   Endometriosis     Surgical History: Past Surgical History:  Procedure Laterality Date   BREAST BIOPSY Left 10-12-12   FIBROADENOMA   COLPOSCOPY W/ BIOPSY / CURETTAGE  01/2016, 2014   CIN 2-3 (2017)   CYSTOSCOPY/URETEROSCOPY/HOLMIUM LASER/STENT PLACEMENT Left 10/21/2020   Procedure: CYSTOSCOPY/URETEROSCOPY/HOLMIUM LASER/STENT PLACEMENT;  Surgeon: Vanna Scotland, MD;  Location: [facility] ORS;  Service: Urology;  Laterality: Left;   LEEP  03/19/2016   CIN 1   PELVIC LAPAROSCOPY  2014   endometriosis    Home Medications:  Allergies as of 11/27/2020       Reactions   Sulfa Antibiotics Hives, Shortness Of Breath, Rash, Other (See Comments)   "Skin turns red" per patient        Medication List        Accurate as of November 27, 2020 11:59 PM. If you have any questions, ask your nurse or doctor.          STOP taking these medications    ketorolac 10 MG tablet Commonly known as: TORADOL Stopped by: Vanna Scotland, MD    ondansetron 4 MG disintegrating tablet Commonly known as: Zofran ODT Stopped by: Vanna Scotland, MD   oxybutynin 5 MG tablet Commonly known as: DITROPAN Stopped by: Vanna Scotland, MD   oxyCODONE-acetaminophen 5-325 MG tablet Commonly known as: PERCOCET/ROXICET Stopped by: Vanna Scotland, MD       TAKE these medications    albuterol 108 (90 Base) MCG/ACT inhaler Commonly known as: VENTOLIN HFA Inhale 1-2 puffs into the lungs every 6 (six) hours as needed for wheezing or shortness of breath.        Allergies:  Allergies  Allergen Reactions   Sulfa Antibiotics Hives, Shortness Of Breath, Rash and Other (See Comments)    "Skin turns red" per patient     Family History: Family History  Problem Relation Age of Onset   Asthma Mother    Diabetes Father    Cancer Maternal Aunt 91       breast    Social History:  reports that she has quit smoking. Her smoking use included cigarettes. She has never used smokeless tobacco. She reports current alcohol use. She reports that she does not use drugs.   Physical Exam: BP 111/76   Pulse 99   Ht 5\' 2"  (1.575 m)   Wt 115 lb (52.2 kg)   BMI 21.03 kg/m   Constitutional:  Alert and oriented, No acute distress. HEENT: [place] AT, moist  mucus membranes.  Trachea midline, no masses. Cardiovascular: No clubbing, cyanosis, or edema. Respiratory: Normal respiratory effort, no increased work of breathing. Skin: No rashes, bruises or suspicious lesions. Neurologic: Grossly intact, no focal deficits, moving all 4 extremities. Psychiatric: Normal mood and affect.  Laboratory Data:  Lab Results  Component Value Date   CREATININE 0.66 10/02/2020     Lab Results  Component Value Date   TESTOSTERONE 21 04/27/2018    Pertinent Imaging: CLINICAL DATA:  Distal left ureteral calculus.   EXAM: RENAL / URINARY TRACT ULTRASOUND COMPLETE   COMPARISON:  Abdomen 10/18/2020.  CT 10/02/2020.   FINDINGS: Right Kidney:   Renal  measurements: 8.6 x 4.5 x 4.2 cm = volume: 84.4 mL. Echogenicity within normal limits. No mass or hydronephrosis visualized.   Left Kidney:   Renal measurements: 10.4 5.0 cm = volume: 136.8 mL. Slight left renal prominence most likely related to prior hydronephrosis. Echogenicity within normal limits. No mass or hydronephrosis visualized.   Bladder:   Appears normal for degree of bladder distention. Bilateral ureteral jets noted.   Other:   None.   IMPRESSION: No acute or focal abnormality. No hydronephrosis noted on today's exam. Previously identified left hydronephrosis noted on 10/02/2020 has resolved.     Electronically Signed   By: Maisie Fus  Register M.D.   On: 11/19/2020 09:53   Renal ultrasound personally reviewed today, agree with radiologic interpretation.  Assessment & Plan:   Left ureteral calculus  -Renal ultrasound reviewed, no evidence of silent hydronephrosis or residual stone burden - This was her first stone encounter  -We discussed general stone prevention techniques including drinking plenty water with goal of producing 2.5 L urine daily, increased citric acid intake, avoidance of high oxalate containing foods, and decreased salt intake.  Information about dietary recommendations given today.    Follow-up as needed  Tawni Millers as a scribe for Vanna Scotland, MD.,have documented all relevant documentation on the behalf of Vanna Scotland, MD,as directed by  Vanna Scotland, MD while in the presence of Vanna Scotland, MD.  I have reviewed the above documentation for accuracy and completeness, and I agree with the above.   Vanna Scotland, MD   [facility] [address] [place], [place] [address] [phone]

## 2020-11-27 ENCOUNTER — Other Ambulatory Visit: Payer: Self-pay

## 2020-11-27 ENCOUNTER — Ambulatory Visit (INDEPENDENT_AMBULATORY_CARE_PROVIDER_SITE_OTHER): Payer: 59 | Admitting: Urology

## 2020-11-27 VITALS — BP 111/76 | HR 99 | Ht 62.0 in | Wt 115.0 lb

## 2020-11-27 DIAGNOSIS — N200 Calculus of kidney: Secondary | ICD-10-CM | POA: Diagnosis not present

## 2020-12-05 ENCOUNTER — Other Ambulatory Visit (HOSPITAL_COMMUNITY)
Admission: RE | Admit: 2020-12-05 | Discharge: 2020-12-05 | Disposition: A | Discharge status: Home / Self Care | Payer: 59 | Source: Ambulatory Visit | Attending: Obstetrics and Gynecology | Admitting: Obstetrics and Gynecology

## 2020-12-05 ENCOUNTER — Encounter: Payer: Self-pay | Admitting: Advanced Practice Midwife

## 2020-12-05 ENCOUNTER — Other Ambulatory Visit: Payer: Self-pay

## 2020-12-05 ENCOUNTER — Ambulatory Visit (INDEPENDENT_AMBULATORY_CARE_PROVIDER_SITE_OTHER): Payer: 59 | Admitting: Advanced Practice Midwife

## 2020-12-05 VITALS — BP 120/80 | Ht 62.0 in | Wt 105.0 lb

## 2020-12-05 DIAGNOSIS — Z124 Encounter for screening for malignant neoplasm of cervix: Secondary | ICD-10-CM | POA: Insufficient documentation

## 2020-12-05 DIAGNOSIS — F3281 Premenstrual dysphoric disorder: Secondary | ICD-10-CM | POA: Diagnosis not present

## 2020-12-05 DIAGNOSIS — Z01419 Encounter for gynecological examination (general) (routine) without abnormal findings: Secondary | ICD-10-CM | POA: Insufficient documentation

## 2020-12-05 MED ORDER — SERTRALINE HCL 50 MG PO TABS: 25.0000 mg | ORAL_TABLET | Freq: Every day | ORAL | 5 refills | Status: DC

## 2020-12-05 NOTE — Patient Instructions (Signed)
Premenstrual Syndrome Premenstrual syndrome (PMS) is a group of physical, emotional, and behavioral symptoms that affect women as part of their menstrual cycle. PMS occurs 1-2 weeks before the start of a woman's menstrual period and goes away a few daysafter menstrual bleeding begins. PMS can range from mild to severe. What are the causes? The exact cause of this condition is not known, but it seems to be related tohormone changes that happen before menstruation. What are the signs or symptoms? Symptoms of this condition often happen every month. They go away after your period starts. Physical symptoms of this condition include: Bloating. Breast pain or tenderness. Headaches. Extreme fatigue. Backaches. Swelling of the hands and feet. Weight gain. Hot flashes. Emotional symptoms of this condition include: Mood swings. Depression. Angry or hostile outbursts. Irritability. Anxiety. Crying spells. Behavioral symptoms include: Food cravings or appetite changes. Changes in sexual desire. Confusion. Social withdrawal. Poor concentration. How is this diagnosed? This condition may be diagnosed based on a history of your symptoms. This condition is generally diagnosed if symptoms of PMS: Are present in the 5 days before your period starts. End within 4 days after your period starts. Happen at least 3 months in a row. Interfere with some of your normal activities. Other conditions that can cause some of these symptoms must be ruled out before PMS can be diagnosed. These include depression, anxiety, anemia, and thyroidproblems. How is this treated? This condition may be treated by doing the following: Maintaining a healthy lifestyle. This includes eating a well-balanced diet and exercising regularly. Taking over-the-counter medicines that can help relieve symptoms, such as cramps, aches, pain, headaches, and breast tenderness. Follow these instructions at home: Eating and drinking  Eat  a well-balanced diet. Avoid caffeine and alcohol. Limit the amount of salt and salty foods you eat. This will help reduce bloating. Drink enough fluid to keep your urine pale yellow. Take a multivitamin if told to do so by your health care provider.  Lifestyle  Do not use any products that contain nicotine or tobacco. These products include cigarettes, chewing tobacco, and vaping devices, such as e-cigarettes. If you need help quitting, ask your health care provider. Exercise regularly as suggested by your health care provider. Get enough sleep. For most adults, this is 7-8 hours of sleep each night. Practice relaxation techniques, such as yoga, tai chi, or meditation. Find healthy ways to manage stress.  General instructions  For 2-3 months, write down your symptoms, whether they are mild to severe, and how long they last. This will help your health care provider choose the best treatment for you. Take over-the-counter and prescription medicines only as told by your health care provider. If you are using birth control pills (oral contraceptives), use them as told by your health care provider.  Contact a health care provider if: Your symptoms get worse. You develop new symptoms. You have trouble doing your daily activities. Summary Premenstrual syndrome (PMS) is a group of physical, emotional, and behavioral symptoms that affect women as part of their menstrual cycle. PMS starts 1-2 weeks before the start of a woman's period and goes away a few days after the period starts. PMS is treated by maintaining a healthy lifestyle and taking medicines to relieve the symptoms. This information is not intended to replace advice given to you by your health care provider. Make sure you discuss any questions you have with your healthcare provider. Document Revised: 11/03/2019 Document Reviewed: 11/03/2019 Elsevier Patient Education  2022 [employer]  

## 2020-12-06 ENCOUNTER — Encounter: Payer: Self-pay | Admitting: Advanced Practice Midwife

## 2020-12-06 NOTE — Progress Notes (Signed)
Gynecology Annual Exam   PCP: Patient, No Pcp Per (Inactive)  Chief Complaint:  Chief Complaint  Patient presents with   Annual Exam    History of Present Illness: Patient is a 30 y.o. G0P0000 presents for annual exam. The patient has complaint today of severe PMS about 1 week before her periods. Her symptoms include depression, anger, hot flashes and some pain. She reports this has been for the past year or two. The symptoms resolve by the time her period starts.  LMP: Patient's last menstrual period was 12/02/2020. Average Interval: regular, 28 days Duration of flow: 3 days Heavy Menses: first day Clots: no Intermenstrual Bleeding: no Postcoital Bleeding: no Dysmenorrhea: mild  The patient is sexually active. She currently uses none for contraception. She admits to dyspareunia related to endometriosis.  The patient does perform self breast exams.  There is no notable family history of breast or ovarian cancer in her family.  The patient wears seatbelts: yes.   The patient has regular exercise: she walks some, her job is sedentary, she admits healthy diet, adequate hydration and sleep.    The patient denies current symptoms of depression.  She has some anxiety and has good coping skills.  Review of Systems: Review of Systems  Constitutional:  Negative for chills and fever.  HENT:  Negative for congestion, ear discharge, ear pain, hearing loss, sinus pain and sore throat.   Eyes:  Negative for blurred vision and double vision.  Respiratory:  Negative for cough, shortness of breath and wheezing.   Cardiovascular:  Negative for chest pain, palpitations and leg swelling.  Gastrointestinal:  Negative for abdominal pain, blood in stool, constipation, diarrhea, heartburn, melena, nausea and vomiting.  Genitourinary:  Negative for dysuria, flank pain, frequency, hematuria and urgency.  Musculoskeletal:  Negative for back pain, joint pain and myalgias.  Skin:  Negative for itching  and rash.  Neurological:  Negative for dizziness, tingling, tremors, sensory change, speech change, focal weakness, seizures, loss of consciousness, weakness and headaches.  Endo/Heme/Allergies:  Negative for environmental allergies. Does not bruise/bleed easily.       Positive for severe pms  Psychiatric/Behavioral:  Negative for depression, hallucinations, memory loss, substance abuse and suicidal ideas. The patient is not nervous/anxious and does not have insomnia.    Past Medical History:  Patient Active Problem List   Diagnosis Date Noted   Breast cyst 10/04/2012   Mastodynia 09/28/2012   Fibrocystic change of breast 09/28/2012    Past Surgical History:  Past Surgical History:  Procedure Laterality Date   BREAST BIOPSY Left 10-12-12   FIBROADENOMA   COLPOSCOPY W/ BIOPSY / CURETTAGE  01/2016, 2014   CIN 2-3 (2017)   CYSTOSCOPY/URETEROSCOPY/HOLMIUM LASER/STENT PLACEMENT Left 10/21/2020   Procedure: CYSTOSCOPY/URETEROSCOPY/HOLMIUM LASER/STENT PLACEMENT;  Surgeon: Vanna Scotland, MD;  Location: [facility] ORS;  Service: Urology;  Laterality: Left;   LEEP  03/19/2016   CIN 1   PELVIC LAPAROSCOPY  2014   endometriosis    Gynecologic History:  Patient's last menstrual period was 12/02/2020. Contraception: none Last Pap: 3 years ago Results were: ASCUS with NEGATIVE high risk HPV   Obstetric History: G0P0000  Family History:  Family History  Problem Relation Age of Onset   Asthma Mother    Diabetes Father    Cancer Maternal Aunt 7       breast   Breast cancer Maternal Aunt 70    Social History:  Social History   Socioeconomic History   Marital status: Single  Spouse name: Not on file   Number of children: Not on file   Years of education: Not on file   Highest education level: Not on file  Occupational History   Not on file  Tobacco Use   Smoking status: Former    Years: 1.00    Types: Cigarettes   Smokeless tobacco: Never  Vaping Use   Vaping Use: Never used   Substance and Sexual Activity   Alcohol use: Yes   Drug use: No   Sexual activity: Yes    Birth control/protection: None  Other Topics Concern   Not on file  Social History Narrative   Not on file   Social Determinants of Health   Financial Resource Strain: Not on file  Food Insecurity: Not on file  Transportation Needs: Not on file  Physical Activity: Not on file  Stress: Not on file  Social Connections: Not on file  Intimate Partner Violence: Not on file    Allergies:  Allergies  Allergen Reactions   Sulfa Antibiotics Hives, Shortness Of Breath, Rash and Other (See Comments)    "Skin turns red" per patient     Medications: Prior to Admission medications   Medication Sig Start Date End Date Taking? Authorizing Provider  albuterol (VENTOLIN HFA) 108 (90 Base) MCG/ACT inhaler Inhale 1-2 puffs into the lungs every 6 (six) hours as needed for wheezing or shortness of breath. 04/11/20  Yes Wieters, Hallie C, PA-C  sertraline (ZOLOFT) 50 MG tablet Take 0.5 tablets (25 mg total) by mouth daily. 12/05/20  Yes Tresea Mall, CNM    Physical Exam Vitals: Blood pressure 120/80, height 5\' 2"  (1.575 m), weight 105 lb (47.6 kg), last menstrual period 12/02/2020.  General: NAD HEENT: normocephalic, anicteric Thyroid: no enlargement, no palpable nodules Pulmonary: No increased work of breathing, CTAB Cardiovascular: RRR, distal pulses 2+ Breast: Breast symmetrical, no tenderness, no palpable nodules or masses, no skin or nipple retraction present, no nipple discharge.  No axillary or supraclavicular lymphadenopathy. Abdomen: NABS, soft, non-tender, non-distended.  Umbilicus without lesions.  No hepatomegaly, splenomegaly or masses palpable. No evidence of hernia  Genitourinary:  External: Normal external female genitalia.  Normal urethral meatus, normal Bartholin's and Skene's glands.    Vagina: Normal vaginal mucosa, no evidence of prolapse.    Cervix: Grossly normal in appearance,  no bleeding, no CMT  Uterus: Non-enlarged, mobile, normal contour.    Adnexa: ovaries non-enlarged, no adnexal masses  Rectal: deferred  Lymphatic: no evidence of inguinal lymphadenopathy Extremities: no edema, erythema, or tenderness Neurologic: Grossly intact Psychiatric: mood appropriate, affect full   Assessment: 30 y.o. G0P0000 routine annual exam  Plan: Problem List Items Addressed This Visit   None Visit Diagnoses     Well woman exam with routine gynecological exam    -  Primary   Relevant Orders   Cytology - PAP   Screening for cervical cancer       Relevant Orders   Cytology - PAP   Premenstrual dysphoric disorder       Relevant Medications   sertraline (ZOLOFT) 50 MG tablet       1) STI screening  was offered and declined  2)  ASCCP guidelines and rationale discussed.  Patient opts for every 3 years screening interval. PAP today  3) Contraception - the patient is currently using  none.    "if it happens it happens"  4) Routine healthcare maintenance including cholesterol, diabetes screening discussed Declines  5) PMDD: start low dose zoloft and adjust  as needed  6) Return in about 1 year (around 12/05/2021) for annual established gyn.   Tresea Mall, CNM [facility] 12/06/2020, 8:06 AM

## 2020-12-12 LAB — CYTOLOGY - PAP
Comment: NEGATIVE
Diagnosis: NEGATIVE
High risk HPV: NEGATIVE

## 2021-06-22 ENCOUNTER — Telehealth: Payer: Self-pay | Admitting: Nurse Practitioner

## 2021-06-22 DIAGNOSIS — J019 Acute sinusitis, unspecified: Secondary | ICD-10-CM

## 2021-06-22 DIAGNOSIS — B9789 Other viral agents as the cause of diseases classified elsewhere: Secondary | ICD-10-CM

## 2021-06-22 MED ORDER — FLUTICASONE PROPIONATE 50 MCG/ACT NA SUSP: 2.0000 | Freq: Every day | NASAL | 0 refills | Status: AC

## 2021-06-22 NOTE — Progress Notes (Signed)
I have spent 5 minutes in review of e-visit questionnaire, review and updating patient chart, medical decision making and response to patient.  ° °Kasara Schomer W Tikia Skilton, NP ° °  °

## 2021-06-22 NOTE — Progress Notes (Signed)
E-Visit for Sinus Problems ? ?We are sorry that you are not feeling well.  Here is how we plan to help! ? ?Based on what you have shared with me it looks like you have sinusitis.  Sinusitis is inflammation and infection in the sinus cavities of the head.  Based on your presentation I believe you most likely have Acute Viral Sinusitis.This is an infection most likely caused by a virus. There is not specific treatment for viral sinusitis other than to help you with the symptoms until the infection runs its course.  You may use an oral decongestant such as Mucinex D or if you have glaucoma or high blood pressure use plain Mucinex. Saline nasal spray help and can safely be used as often as needed for congestion, I have prescribed: Fluticasone nasal spray two sprays in each nostril once a day ? ?Providers prescribe antibiotics to treat infections caused by bacteria. Antibiotics are very powerful in treating bacterial infections when they are used properly. To maintain their effectiveness, they should be used only when necessary. Overuse of antibiotics has resulted in the development of superbugs that are resistant to treatment!   ? ?After careful review of your answers, I would not recommend an antibiotic for your condition.  Antibiotics are not effective against viruses and therefore should not be used to treat them. Common examples of infections caused by viruses include colds and flu  ?If your symptoms worsen over the next 4-5 days please feel free to reach out to [place] or your PCP for further recommendations and or treatment.  ?Some authorities believe that zinc sprays or the use of Echinacea may shorten the course of your symptoms. ? ?Sinus infections are not as easily transmitted as other respiratory infection, however we still recommend that you avoid close contact with loved ones, especially the very young and elderly.  Remember to wash your hands thoroughly throughout the day as this is the number one way to  prevent the spread of infection! ? ?Home Care: ?Only take medications as instructed by your medical team. ?Do not take these medications with alcohol. ?A steam or ultrasonic humidifier can help congestion.  You can place a towel over your head and breathe in the steam from hot water coming from a faucet. ?Avoid close contacts especially the very young and the elderly. ?Cover your mouth when you cough or sneeze. ?Always remember to wash your hands. ? ?Get Help Right Away If: ?You develop worsening fever or sinus pain. ?You develop a severe head ache or visual changes. ?Your symptoms persist after you have completed your treatment plan. ? ?Make sure you ?Understand these instructions. ?Will watch your condition. ?Will get help right away if you are not doing well or get worse. ? ? ?Thank you for choosing an e-visit. ? ?Your e-visit answers were reviewed by a board certified advanced clinical practitioner to complete your personal care plan. Depending upon the condition, your plan could have included both over the counter or prescription medications. ? ?Please review your pharmacy choice. Make sure the pharmacy is open so you can pick up prescription now. If there is a problem, you may contact your provider through [employer] and have the prescription routed to another pharmacy.  Your safety is important to [place]. If you have drug allergies check your prescription carefully.  ? ?For the next 24 hours you can use MyChart to ask questions about today's visit, request a non-urgent call back, or ask for a work or school excuse. ?You  will get an email in the next two days asking about your experience. I hope that your e-visit has been valuable and will speed your recovery. ?  ?

## 2021-07-05 ENCOUNTER — Other Ambulatory Visit: Payer: Self-pay | Admitting: Advanced Practice Midwife

## 2021-07-05 DIAGNOSIS — F3281 Premenstrual dysphoric disorder: Secondary | ICD-10-CM

## 2021-09-09 IMAGING — US US RENAL
1 series · 14 of 25 positions shown · non-contrast
Comparison: Abdomen 10/18/2020.  CT 10/02/2020.

CLINICAL DATA: Distal left ureteral calculus.

EXAM:
RENAL / URINARY TRACT ULTRASOUND COMPLETE

[Series 1: us renal · 0.20mm/px · 14 of 28 slices shown]
[im 1/28]
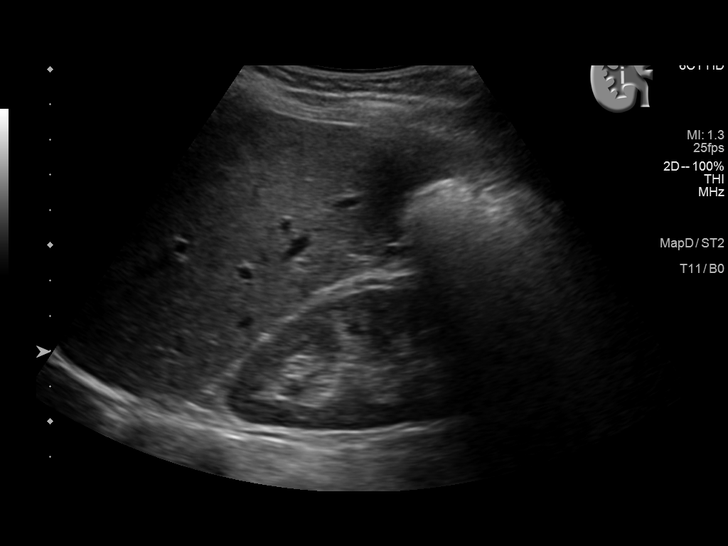
[im 3/28]
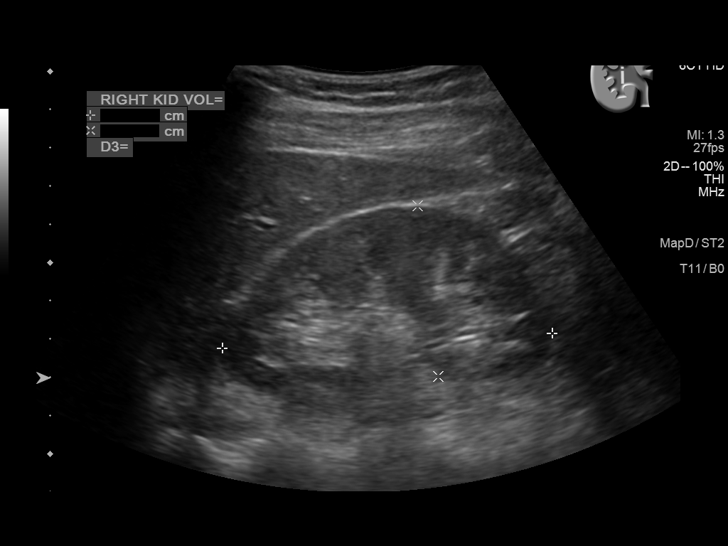
[im 5/28]
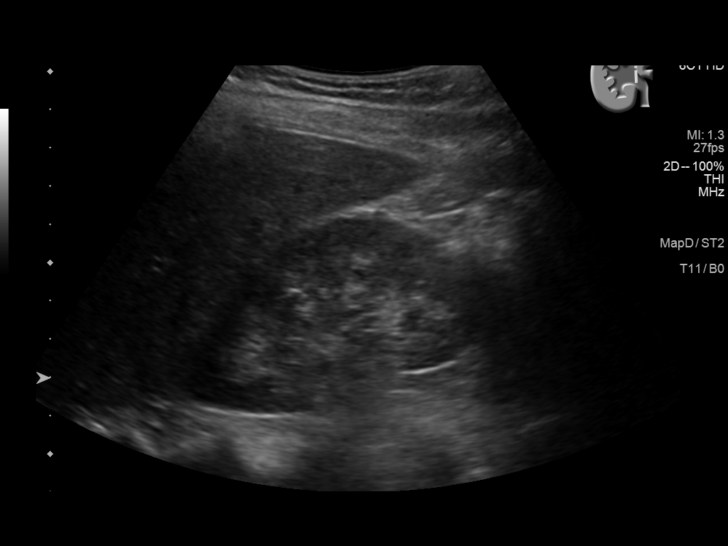
[im 7/28]
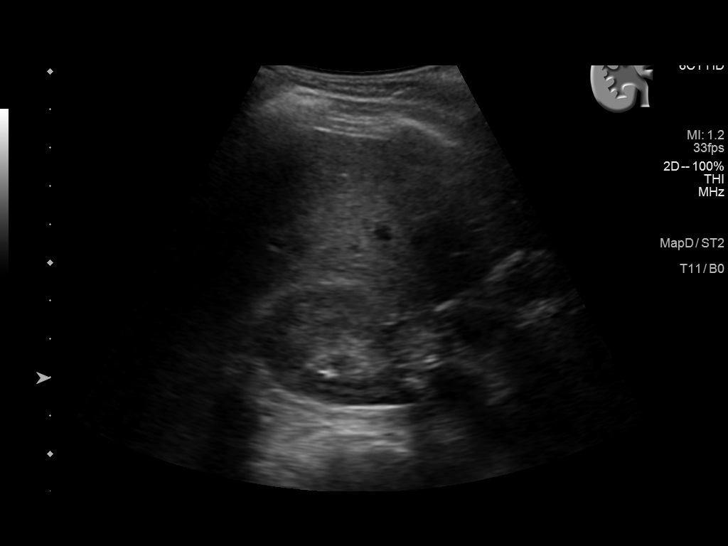
[im 10/28]
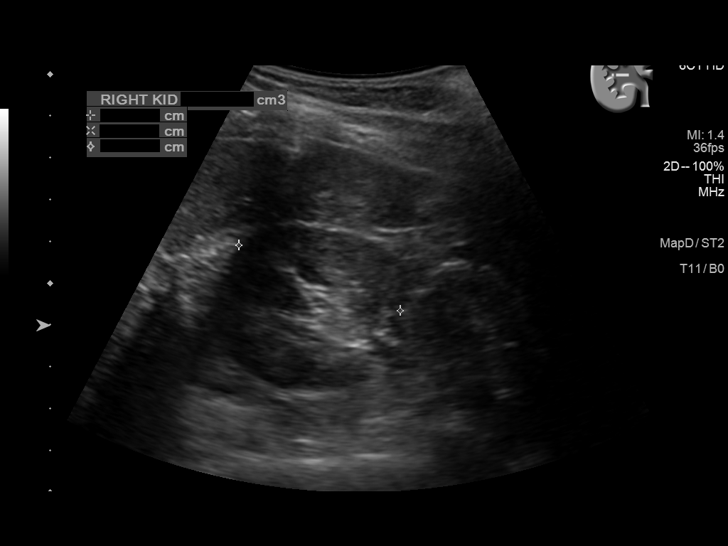
[im 11/28]
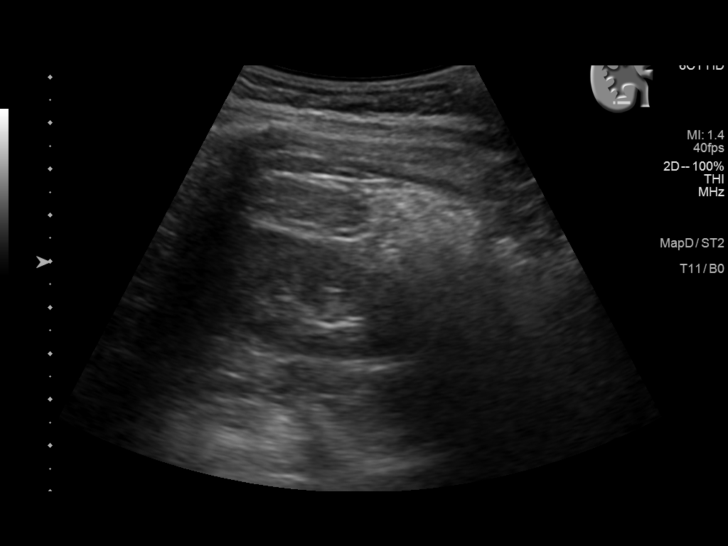
[im 13/28]
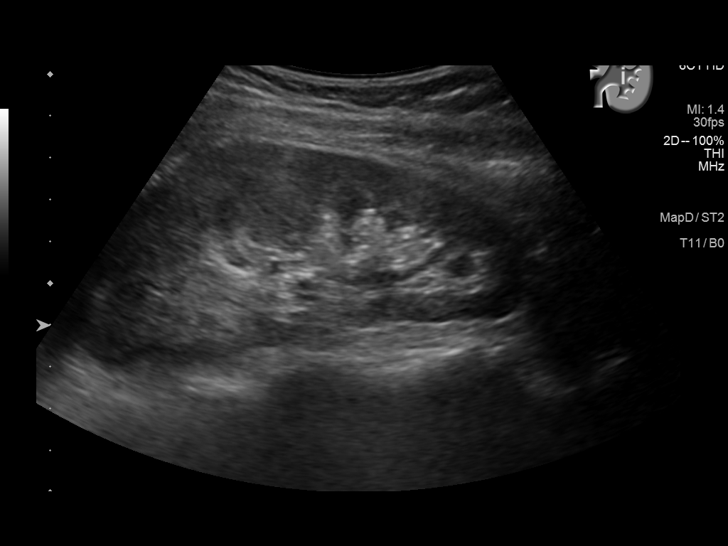
[im 15/28]
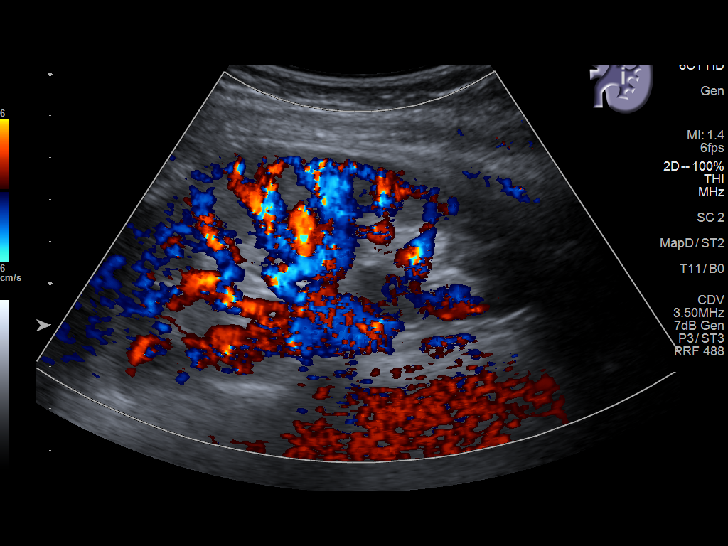
[im 17/28]
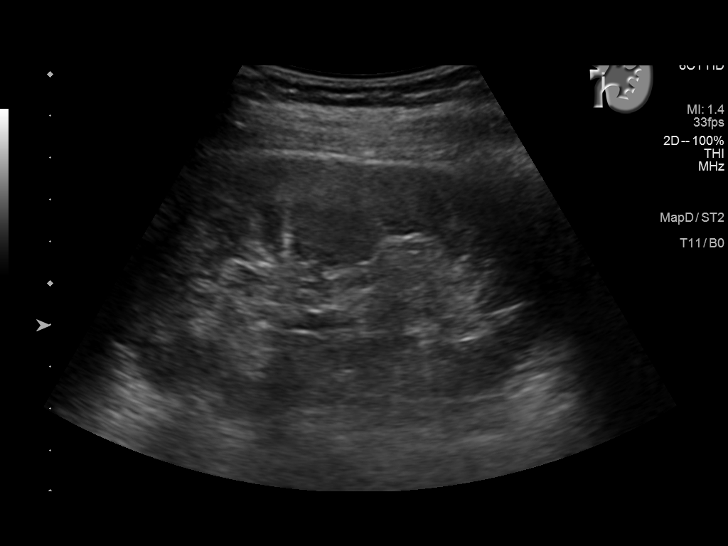
[im 19/28]
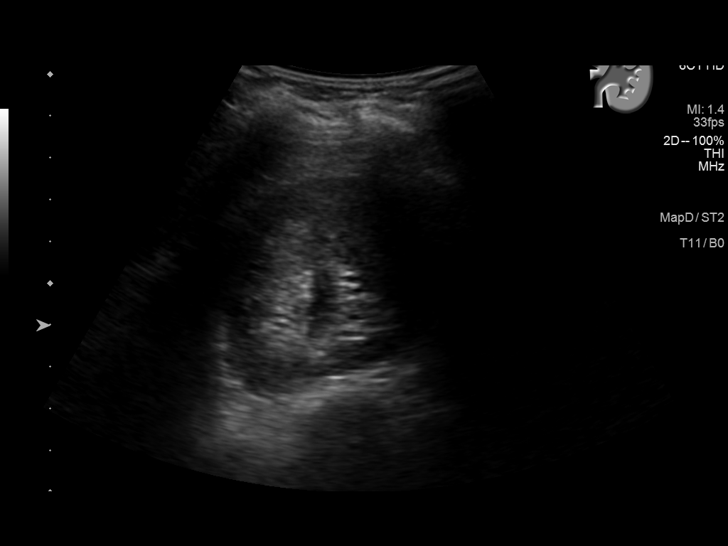
[im 21/28]
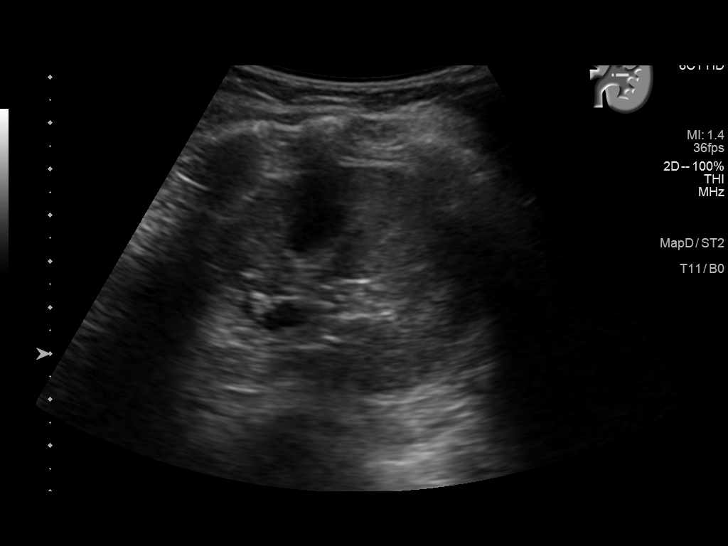
[im 23/28]
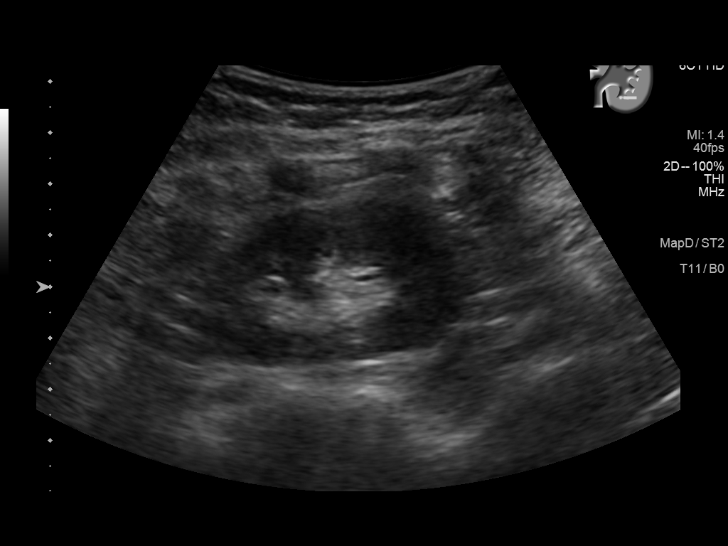
[im 25/28]
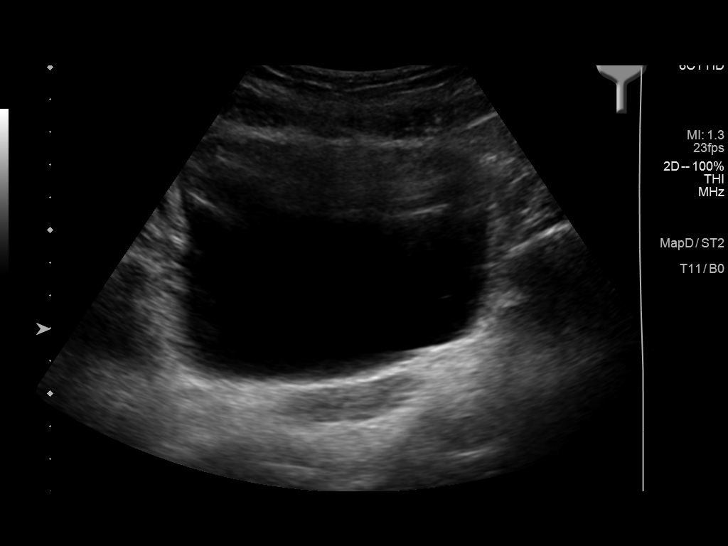
[im 28/28]
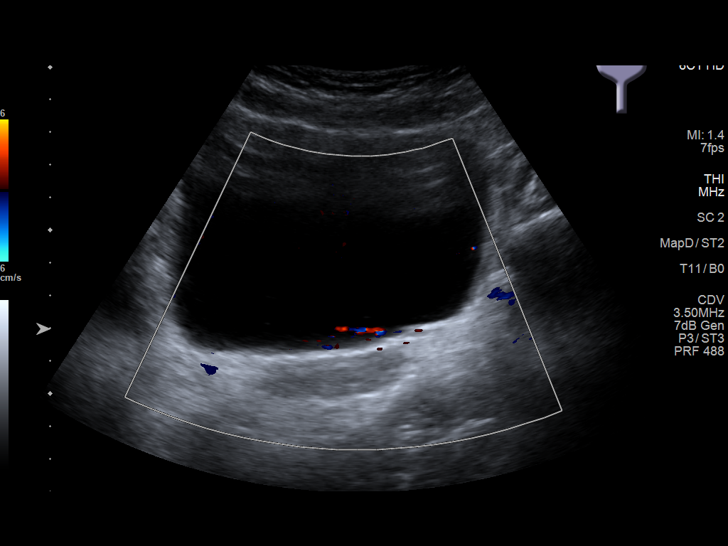

[14 of 25 positions shown; findings below may reference images not displayed]

FINDINGS: Right Kidney:

Renal measurements: 8.6 x 4.5 x 4.2 cm = volume: 84.4 mL.
Echogenicity within normal limits. No mass or hydronephrosis
visualized.

Left Kidney:

Renal measurements: 10.4 5.0 cm = volume: 136.8 mL. Slight left
renal prominence most likely related to prior hydronephrosis.
Echogenicity within normal limits. No mass or hydronephrosis
visualized.

Bladder:

Appears normal for degree of bladder distention. Bilateral ureteral
jets noted.

Other:

None.
IMPRESSION: No acute or focal abnormality. No hydronephrosis noted on today's
exam. Previously identified left hydronephrosis noted on 10/02/2020
has resolved.

## 2022-04-22 ENCOUNTER — Ambulatory Visit
Admission: RE | Admit: 2022-04-22 | Discharge: 2022-04-22 | Disposition: A | Discharge status: Home / Self Care | Payer: Managed Care, Other (non HMO) | Source: Ambulatory Visit | Attending: Urgent Care | Admitting: Urgent Care

## 2022-04-22 VITALS — BP 112/77 | HR 119 | Temp 99.3°F | Resp 17

## 2022-04-22 DIAGNOSIS — J069 Acute upper respiratory infection, unspecified: Secondary | ICD-10-CM | POA: Diagnosis not present

## 2022-04-22 MED ORDER — BENZONATATE 100 MG PO CAPS: ORAL_CAPSULE | ORAL | 0 refills | Status: DC

## 2022-04-22 NOTE — ED Provider Notes (Signed)
Brenda Knight    CSN: [id number] Arrival date & time: 04/22/22  1027      History   Chief Complaint Chief Complaint  Patient presents with   Cough    Cough, burning chest and headache. Tested negative for covid - Entered by patient    HPI Brenda Knight is a 32 y.o. female.    Cough   Patient presents with symptoms starting yesterday.  She endorses cough and burning sensation in her throat.  COVID test this morning at home was negative.  Past Medical History:  Diagnosis Date   Asthma    C. difficile colitis 2008   Endometriosis     Patient Active Problem List   Diagnosis Date Noted   Lumbar stenosis without neurogenic claudication 01/19/2020   Mastodynia 09/28/2012   Fibrocystic change of breast 09/28/2012    Past Surgical History:  Procedure Laterality Date   BREAST BIOPSY Left 10-12-12   FIBROADENOMA   COLPOSCOPY W/ BIOPSY / CURETTAGE  01/2016, 2014   CIN 2-3 (2017)   CYSTOSCOPY/URETEROSCOPY/HOLMIUM LASER/STENT PLACEMENT Left 10/21/2020   Procedure: CYSTOSCOPY/URETEROSCOPY/HOLMIUM LASER/STENT PLACEMENT;  Surgeon: Hollice Espy, MD;  Location: [facility] ORS;  Service: Urology;  Laterality: Left;   LEEP  03/19/2016   CIN 1   PELVIC LAPAROSCOPY  2014   endometriosis    OB History     Gravida  0   Para  0   Term  0   Preterm  0   AB  0   Living  0      SAB  0   IAB  0   Ectopic  0   Multiple  0   Live Births  0        Obstetric Comments  1st Menstrual Cycle: 13 1st Pregnancy: NA          Home Medications    Prior to Admission medications   Medication Sig Start Date End Date Taking? Authorizing Provider  albuterol (VENTOLIN HFA) 108 (90 Base) MCG/ACT inhaler Inhale 1-2 puffs into the lungs every 6 (six) hours as needed for wheezing or shortness of breath. 04/11/20   Wieters, Hallie C, PA-C  fluticasone (FLONASE) 50 MCG/ACT nasal spray Place 2 sprays into both nostrils daily. 06/22/21   Gildardo Pounds, NP  sertraline  (ZOLOFT) 50 MG tablet TAKE 1/2 TABLET BY MOUTH DAILY 07/07/21   Rod Can, CNM    Family History Family History  Problem Relation Age of Onset   Asthma Mother    Diabetes Father    Cancer Maternal Aunt 54       breast   Breast cancer Maternal Aunt 55    Social History Social History   Tobacco Use   Smoking status: Former    Years: 1.00    Types: Cigarettes   Smokeless tobacco: Never  Vaping Use   Vaping Use: Never used  Substance Use Topics   Alcohol use: Yes   Drug use: No     Allergies   Sulfa antibiotics   Review of Systems Review of Systems  Respiratory:  Positive for cough.      Physical Exam Triage Vital Signs ED Triage Vitals [04/22/22 1034]  Enc Vitals Group     BP 112/77     Pulse Rate (!) 119     Resp 17     Temp 99.3 F (37.4 C)     Temp src      SpO2 98 %     Weight  Height      Head Circumference      Peak Flow      Pain Score 0     Pain Loc      Pain Edu?      Excl. in [place]?    No data found.  Updated Vital Signs BP 112/77   Pulse (!) 119   Temp 99.3 F (37.4 C)   Resp 17   LMP 04/07/2022 (Exact Date)   SpO2 98%   Visual Acuity Right Eye Distance:   Left Eye Distance:   Bilateral Distance:    Right Eye Near:   Left Eye Near:    Bilateral Near:     Physical Exam Vitals reviewed.  Constitutional:      Appearance: Normal appearance.  Cardiovascular:     Rate and Rhythm: Normal rate and regular rhythm.     Pulses: Normal pulses.     Heart sounds: Normal heart sounds.  Pulmonary:     Effort: Pulmonary effort is normal.     Breath sounds: Normal breath sounds.  Skin:    General: Skin is warm and dry.  Neurological:     General: No focal deficit present.     Mental Status: She is alert and oriented to person, place, and time.  Psychiatric:        Mood and Affect: Mood normal.        Behavior: Behavior normal.      UC Treatments / Results  Labs (all labs ordered are listed, but only abnormal results  are displayed) Labs Reviewed - No data to display  EKG   Radiology No results found.  Procedures Procedures (including critical care time)  Medications Ordered in UC Medications - No data to display  Initial Impression / Assessment and Plan / UC Course  I have reviewed the triage vital signs and the nursing notes.  Pertinent labs & imaging results that were available during my care of the patient were reviewed by me and considered in my medical decision making (see chart for details).   Patient is afebrile here without recent antipyretics. Satting well on room air. Overall is well appearing, well hydrated, without respiratory distress. Pulmonary exam is unremarkable.  Lungs CTAB without wheezing, rhonchi, rales.  Symptoms are consistent with an acute viral process.  Will prescribe benzonatate for cough.  Otherwise recommending use of OTC medication for symptom control.  Final Clinical Impressions(s) / UC Diagnoses   Final diagnoses:  None   Discharge Instructions   None    ED Prescriptions   None    PDMP not reviewed this encounter.   Rose Phi, [place] 04/22/22 1103

## 2022-04-22 NOTE — Discharge Instructions (Addendum)
You have been diagnosed with a viral upper respiratory infection based on your symptoms and exam. Viral illnesses cannot be treated with antibiotics - they are self limiting - and you should find your symptoms resolving within a few days. Get plenty of rest and non-caffeinated fluids. Watch for signs of dehydration including reduced urine output and dark colored urine.  We recommend you use over-the-counter medications for symptom control including acetaminophen (Tylenol), ibuprofen (Advil/Motrin) or naproxen (Aleve) for throat pain, fever, chills or body aches. You may combine use of acetaminophen and ibuprofen/naproxen if needed.  Some patients find an pain-relieving throat spray such as Chloraseptic to be effective.  Also recommend cold/cough medication containing a cough suppressant such as dextromethorphan, as needed.  I have prescribed a cough suppressant to use if OTC medications are not adequate.  Saline mist spray is helpful for removing excess mucus from your nose.  Room humidifiers are helpful to ease breathing at night. I recommend guaifenesin (Mucinex) with plenty of water throughout the day to help thin and loosen mucus secretions in your respiratory passages.   If appropriate based upon your other medical problems, you might also find relief of nasal/sinus congestion symptoms by using a nasal decongestant such as fluticasone (Flonase ) or pseudoephedrine (Sudafed sinus).  You will need to obtain Sudafed from behind the pharmacist counter.  Speak to the pharmacist to verify that you are not duplicating medications with other over-the-counter formulations that you may be using.

## 2022-04-22 NOTE — ED Triage Notes (Addendum)
Pt. Presents to UC w/ c/o a cough and a burning sensation in her throat that started yesterday. Pt. States she took a COVID test this morning which was negative.

## 2022-04-27 ENCOUNTER — Telehealth: Payer: Managed Care, Other (non HMO) | Admitting: Physician Assistant

## 2022-04-27 DIAGNOSIS — B9689 Other specified bacterial agents as the cause of diseases classified elsewhere: Secondary | ICD-10-CM

## 2022-04-27 DIAGNOSIS — J019 Acute sinusitis, unspecified: Secondary | ICD-10-CM | POA: Diagnosis not present

## 2022-04-27 MED ORDER — PROMETHAZINE-DM 6.25-15 MG/5ML PO SYRP: 5.0000 mL | ORAL_SOLUTION | Freq: Four times a day (QID) | ORAL | 0 refills | Status: AC | PRN

## 2022-04-27 MED ORDER — AMOXICILLIN-POT CLAVULANATE 875-125 MG PO TABS: 1.0000 | ORAL_TABLET | Freq: Two times a day (BID) | ORAL | 0 refills | Status: AC

## 2022-04-27 NOTE — Patient Instructions (Signed)
Felicie Morn, thank you for joining [name], PA-C for today's virtual visit.  While this provider is not your primary care provider (PCP), if your PCP is located in our provider database this encounter information will be shared with them immediately following your visit.   [facility] account gives you access to today's visit and all your visits, tests, and labs performed at [facility] " click here if you don't have a [facility] account or go to [email or web address]  Consent: (Patient) Brenda Knight provided verbal consent for this virtual visit at the beginning of the encounter.  Current Medications:  Current Outpatient Medications:    fluticasone (FLONASE) 50 MCG/ACT nasal spray, Place 2 sprays into both nostrils daily., Disp: 18.2 mL, Rfl: 0   Medications ordered in this encounter:  No orders of the defined types were placed in this encounter.    *If you need refills on other medications prior to your next appointment, please contact your pharmacy*  Follow-Up: Call back or seek an in-person evaluation if the symptoms worsen or if the condition fails to improve as anticipated.  [place] [phone]  Other Instructions Please take antibiotic as directed.  Increase fluid intake.  Use Saline nasal spray.  Take a daily multivitamin. Continue the Flonase as directed. Start OTC Claritin or Zyrtec.  Place a humidifier in the bedroom.  Please call or return clinic if symptoms are not improving.  Sinusitis Sinusitis is redness, soreness, and swelling (inflammation) of the paranasal sinuses. Paranasal sinuses are air pockets within the bones of your face (beneath the eyes, the middle of the forehead, or above the eyes). In healthy paranasal sinuses, mucus is able to drain out, and air is able to circulate through them by way of your nose. However, when your paranasal sinuses are inflamed, mucus and air can become trapped.  This can allow bacteria and other germs to grow and cause infection. Sinusitis can develop quickly and last only a short time (acute) or continue over a long period (chronic). Sinusitis that lasts for more than 12 weeks is considered chronic.  CAUSES  Causes of sinusitis include: Allergies. Structural abnormalities, such as displacement of the cartilage that separates your nostrils (deviated septum), which can decrease the air flow through your nose and sinuses and affect sinus drainage. Functional abnormalities, such as when the small hairs (cilia) that line your sinuses and help remove mucus do not work properly or are not present. SYMPTOMS  Symptoms of acute and chronic sinusitis are the same. The primary symptoms are pain and pressure around the affected sinuses. Other symptoms include: Upper toothache. Earache. Headache. Bad breath. Decreased sense of smell and taste. A cough, which worsens when you are lying flat. Fatigue. Fever. Thick drainage from your nose, which often is green and may contain pus (purulent). Swelling and warmth over the affected sinuses. DIAGNOSIS  Your caregiver will perform a physical exam. During the exam, your caregiver may: Look in your nose for signs of abnormal growths in your nostrils (nasal polyps). Tap over the affected sinus to check for signs of infection. View the inside of your sinuses (endoscopy) with a special imaging device with a light attached (endoscope), which is inserted into your sinuses. If your caregiver suspects that you have chronic sinusitis, one or more of the following tests may be recommended: Allergy tests. Nasal culture A sample of mucus is taken from your nose and sent to a lab and screened for bacteria.  Nasal cytology A sample of mucus is taken from your nose and examined by your caregiver to determine if your sinusitis is related to an allergy. TREATMENT  Most cases of acute sinusitis are related to a viral infection and will  resolve on their own within 10 days. Sometimes medicines are prescribed to help relieve symptoms (pain medicine, decongestants, nasal steroid sprays, or saline sprays).  However, for sinusitis related to a bacterial infection, your caregiver will prescribe antibiotic medicines. These are medicines that will help kill the bacteria causing the infection.  Rarely, sinusitis is caused by a fungal infection. In theses cases, your caregiver will prescribe antifungal medicine. For some cases of chronic sinusitis, surgery is needed. Generally, these are cases in which sinusitis recurs more than 3 times per year, despite other treatments. HOME CARE INSTRUCTIONS  Drink plenty of water. Water helps thin the mucus so your sinuses can drain more easily. Use a humidifier. Inhale steam 3 to 4 times a day (for example, sit in the bathroom with the shower running). Apply a warm, moist washcloth to your face 3 to 4 times a day, or as directed by your caregiver. Use saline nasal sprays to help moisten and clean your sinuses. Take over-the-counter or prescription medicines for pain, discomfort, or fever only as directed by your caregiver. SEEK IMMEDIATE MEDICAL CARE IF: You have increasing pain or severe headaches. You have nausea, vomiting, or drowsiness. You have swelling around your face. You have vision problems. You have a stiff neck. You have difficulty breathing. MAKE SURE YOU:  Understand these instructions. Will watch your condition. Will get help right away if you are not doing well or get worse. Document Released: 03/16/2005 Document Revised: 06/08/2011 Document Reviewed: 03/31/2011 [facility] Patient Information 2014 [place], [place].    If you have been instructed to have an in-person evaluation today at a local Urgent Care facility, please use the link below. It will take you to a list of all of our available [facility], including address, phone number and hours of operation. Please  do not delay care.  [facility]  If you or a family member do not have a primary care provider, use the link below to schedule a visit and establish care. When you choose a [facility] primary care physician or advanced practice provider, you gain a long-term partner in health. Find a Primary Care Provider  Learn more about [facility]'s in-office and virtual care options: [facility]

## 2022-04-27 NOTE — Progress Notes (Signed)
Virtual Visit Consent   Brenda Knight, you are scheduled for a virtual visit with a [facility] provider today. Just as with appointments in the office, your consent must be obtained to participate. Your consent will be active for this visit and any virtual visit you may have with one of our providers in the next 365 days. If you have a MyChart account, a copy of this consent can be sent to you electronically.  As this is a virtual visit, video technology does not allow for your provider to perform a traditional examination. This may limit your provider's ability to fully assess your condition. If your provider identifies any concerns that need to be evaluated in person or the need to arrange testing (such as labs, EKG, etc.), we will make arrangements to do so. Although advances in technology are sophisticated, we cannot ensure that it will always work on either your end or our end. If the connection with a video visit is poor, the visit may have to be switched to a telephone visit. With either a video or telephone visit, we are not always able to ensure that we have a secure connection.  By engaging in this virtual visit, you consent to the provision of healthcare and authorize for your insurance to be billed (if applicable) for the services provided during this visit. Depending on your insurance coverage, you may receive a charge related to this service.  I need to obtain your verbal consent now. Are you willing to proceed with your visit today? [name] has provided verbal consent on 04/27/2022 for a virtual visit (video or telephone). Brenda Knight, [place]  Date: 04/27/2022 1:45 PM  Virtual Visit via Video Note   I, Brenda Knight, connected with  [name]  ([id number], [birth year]) on 04/27/22 at  1:45 PM EST by a video-enabled telemedicine application and verified that I am speaking with the correct person using two identifiers.  Location: Patient: Virtual Visit Location  Patient: Home Provider: Virtual Visit Location Provider: Home Office   I discussed the limitations of evaluation and management by telemedicine and the availability of in person appointments. The patient expressed understanding and agreed to proceed.    History of Present Illness: Brenda Knight is a 32 y.o. who identifies as a female who was assigned female at birth, and is being seen today for sinus pain. Initially with URI symptoms starting 1/23 with nasal congestion, chest congestion and cough. Was evaluated at urgent care on 1/23 and diagnosed with viral URI w cough. Started on [employer] Notes cough worsened initially but began to improve somewhat. Head congestion continued to progress. Now overnight with sinus pain (R maxillary) and facial pain. Denies fever, chills.[name]  OTC -- Flonase, Mucinex.  HPI: HPI  Problems:  Patient Active Problem List   Diagnosis Date Noted   Lumbar stenosis without neurogenic claudication 01/19/2020   Mastodynia 09/28/2012   Fibrocystic change of breast 09/28/2012    Allergies:  Allergies  Allergen Reactions   Sulfa Antibiotics Hives, Shortness Of Breath, Rash and Other (See Comments)    "Skin turns red" per patient    Medications:  Current Outpatient Medications:    amoxicillin-clavulanate (AUGMENTIN) 875-125 MG tablet, Take 1 tablet by mouth 2 (two) times daily., Disp: 14 tablet, Rfl: 0   promethazine-dextromethorphan (PROMETHAZINE-DM) 6.25-15 MG/5ML syrup, Take 5 mLs by mouth 4 (four) times daily as needed for cough., Disp: 118 mL, Rfl: 0   fluticasone (FLONASE) 50 MCG/ACT nasal spray,  Place 2 sprays into both nostrils daily., Disp: 18.2 mL, Rfl: 0  Observations/Objective: Patient is well-developed, well-nourished in no acute distress.  Resting comfortably at home.  Head is normocephalic, atraumatic.  No labored breathing. Speech is clear and coherent with logical content.  Patient is alert and oriented at baseline.  Assessment and  Plan: 1. Acute bacterial sinusitis - amoxicillin-clavulanate (AUGMENTIN) 875-125 MG tablet; Take 1 tablet by mouth 2 (two) times daily.  Dispense: 14 tablet; Refill: 0 - promethazine-dextromethorphan (PROMETHAZINE-DM) 6.25-15 MG/5ML syrup; Take 5 mLs by mouth 4 (four) times daily as needed for cough.  Dispense: 118 mL; Refill: 0  Rx Augmentin.  Increase fluids.  Rest.  Saline nasal spray.  Probiotic.  Mucinex as directed.  Humidifier in bedroom. Ok to continue [facility].   Call or return to clinic if symptoms are not improving.   Follow Up Instructions: I discussed the assessment and treatment plan with the patient. The patient was provided an opportunity to ask questions and all were answered. The patient agreed with the plan and demonstrated an understanding of the instructions.  A copy of instructions were sent to the patient via MyChart unless otherwise noted below.   The patient was advised to call back or seek an in-person evaluation if the symptoms worsen or if the condition fails to improve as anticipated.  Time:  I spent 10 minutes with the patient via telehealth technology discussing the above problems/concerns.    Brenda Rio, PA-C

## 2022-11-26 ENCOUNTER — Ambulatory Visit: Payer: Self-pay | Admitting: Family Medicine

## 2023-02-05 ENCOUNTER — Ambulatory Visit: Payer: Managed Care, Other (non HMO) | Admitting: Family Medicine

## 2023-02-07 ENCOUNTER — Ambulatory Visit
Admission: EM | Admit: 2023-02-07 | Discharge: 2023-02-07 | Disposition: A | Discharge status: Home / Self Care | Payer: Managed Care, Other (non HMO) | Attending: Emergency Medicine | Admitting: Emergency Medicine

## 2023-02-07 ENCOUNTER — Ambulatory Visit: Payer: Managed Care, Other (non HMO)

## 2023-02-07 ENCOUNTER — Encounter: Payer: Self-pay | Admitting: *Deleted

## 2023-02-07 ENCOUNTER — Telehealth: Payer: Self-pay | Admitting: Emergency Medicine

## 2023-02-07 DIAGNOSIS — M25532 Pain in left wrist: Secondary | ICD-10-CM | POA: Diagnosis not present

## 2023-02-07 NOTE — Telephone Encounter (Signed)
Reported x-ray results via telephone, 2 patient identifiers used, will move forward with treatment plan as discussed

## 2023-02-07 NOTE — ED Triage Notes (Signed)
Patient states she was skate boarding yesterday afternoon and fell awkwardly on left wrist w/ pain shooting up forearm.  Taking Ibuprofen with little relief.  Can use hand hurts to try to do wrist circles

## 2023-02-07 NOTE — Discharge Instructions (Signed)
X-rays pending, you will be notified of results tomorrow morning via telephone  You may use ice or heat over the affected area in 10 to 15-minute intervals  You may take Tylenol and/or Motrin as needed for pain  You have been placed in a wrist brace that stability and support, wear at all times until you were given x-ray result, may remove for showering  If not broken then you may wear as needed until pain resolves  If there is a break in the bone you will need to wear at all times until evaluated by orthopedic specialist  If there is a break in the bone you will need to follow-up with orthopedics in 1 to 2 weeks  If there is no break in the bone you may follow-up only if your symptoms do not improve by 2 to 3 weeks post injury

## 2023-02-07 NOTE — ED Provider Notes (Signed)
Renaldo Fiddler    CSN: [id number] Arrival date & time: 02/07/23  4259      History   Chief Complaint Chief Complaint  Patient presents with   Fall    HPI Brenda Knight is a 32 y.o. female.   Patient presents for evaluation of pain to left wrist and forearm beginning 1 day ago after injury.  Was on a skateboard when she fell landing with the palm down.  Now having pain along the radial aspect shooting into the forearm.  Pain can be felt with movement and having limitations with rotating the arm.  Denies numbness or tingling.  Has attempted use of ibuprofen with minimal relief.  Past Medical History:  Diagnosis Date   Asthma    C. difficile colitis 2008   Endometriosis     Patient Active Problem List   Diagnosis Date Noted   Lumbar stenosis without neurogenic claudication 01/19/2020   Mastodynia 09/28/2012   Fibrocystic change of breast 09/28/2012    Past Surgical History:  Procedure Laterality Date   BREAST BIOPSY Left 10-12-12   FIBROADENOMA   COLPOSCOPY W/ BIOPSY / CURETTAGE  01/2016, 2014   CIN 2-3 (2017)   CYSTOSCOPY/URETEROSCOPY/HOLMIUM LASER/STENT PLACEMENT Left 10/21/2020   Procedure: CYSTOSCOPY/URETEROSCOPY/HOLMIUM LASER/STENT PLACEMENT;  Surgeon: Vanna Scotland, MD;  Location: [facility] ORS;  Service: Urology;  Laterality: Left;   LEEP  03/19/2016   CIN 1   PELVIC LAPAROSCOPY  2014   endometriosis    OB History     Gravida  0   Para  0   Term  0   Preterm  0   AB  0   Living  0      SAB  0   IAB  0   Ectopic  0   Multiple  0   Live Births  0        Obstetric Comments  1st Menstrual Cycle: 13 1st Pregnancy: NA          Home Medications    Prior to Admission medications   Medication Sig Start Date End Date Taking? Authorizing Provider  amoxicillin-clavulanate (AUGMENTIN) 875-125 MG tablet Take 1 tablet by mouth 2 (two) times daily. 04/27/22   Waldon Merl, PA-C  fluticasone (FLONASE) 50 MCG/ACT nasal spray Place  2 sprays into both nostrils daily. 06/22/21   Claiborne Rigg, NP  promethazine-dextromethorphan (PROMETHAZINE-DM) 6.25-15 MG/5ML syrup Take 5 mLs by mouth 4 (four) times daily as needed for cough. 04/27/22   Waldon Merl, PA-C    Family History Family History  Problem Relation Age of Onset   Asthma Mother    Diabetes Father    Cancer Maternal Aunt 52       breast   Breast cancer Maternal Aunt 58    Social History Social History   Tobacco Use   Smoking status: Former    Types: Cigarettes   Smokeless tobacco: Never  Vaping Use   Vaping status: Never Used  Substance Use Topics   Alcohol use: Yes    Comment: socially a few times a month   Drug use: No     Allergies   Sulfa antibiotics   Review of Systems Review of Systems   Physical Exam Triage Vital Signs ED Triage Vitals  Encounter Vitals Group     BP 02/07/23 0854 101/69     Systolic BP Percentile --      Diastolic BP Percentile --      Pulse Rate 02/07/23 0854 81  Resp 02/07/23 0854 16     Temp 02/07/23 0854 99 F (37.2 C)     Temp Source 02/07/23 0854 Oral     SpO2 02/07/23 0854 97 %     Weight 02/07/23 0852 110 lb (49.9 kg)     Height 02/07/23 0852 5\' 2"  (1.575 m)     Head Circumference --      Peak Flow --      Pain Score 02/07/23 0852 6     Pain Loc --      Pain Education --      Exclude from Growth Chart --    No data found.  Updated Vital Signs BP 101/69 (BP Location: Left Arm)   Pulse 81   Temp 99 F (37.2 C) (Oral)   Resp 16   Ht 5\' 2"  (1.575 m)   Wt 110 lb (49.9 kg)   LMP 01/31/2023 (Approximate)   SpO2 97%   BMI 20.12 kg/m   Visual Acuity Right Eye Distance:   Left Eye Distance:   Bilateral Distance:    Right Eye Near:   Left Eye Near:    Bilateral Near:     Physical Exam Constitutional:      Appearance: Normal appearance.  Eyes:     Extraocular Movements: Extraocular movements intact.  Pulmonary:     Effort: Pulmonary effort is normal.  Musculoskeletal:      Comments: Unable to reproduce tenderness, no ecchymosis swelling or deformity, 2+ radial pulse limitations on supination  Neurological:     Mental Status: She is alert and oriented to person, place, and time. Mental status is at baseline.      UC Treatments / Results  Labs (all labs ordered are listed, but only abnormal results are displayed) Labs Reviewed - No data to display  EKG   Radiology No results found.  Procedures Procedures (including critical care time)  Medications Ordered in UC Medications - No data to display  Initial Impression / Assessment and Plan / UC Course  I have reviewed the triage vital signs and the nursing notes.  Pertinent labs & imaging results that were available during my care of the patient were reviewed by me and considered in my medical decision making (see chart for details).  Acute pain of left wrist  X-ray pending, given wrist brace for stability and support, if fracture noted patient to wear at all times with exception of showering and then follow-up with orthopedics in 1 to 2 weeks, if not broken may wear as needed with follow-up with orthopedics if no improvement seen by 2 to 3 weeks, declined prescription for steroids, recommended over-the-counter NSAIDs, ice and heat with elevation, may follow-up with urgent care as needed Final Clinical Impressions(s) / UC Diagnoses   Final diagnoses:  Acute pain of left wrist     Discharge Instructions      X-rays pending, you will be notified of results tomorrow morning via telephone  You may use ice or heat over the affected area in 10 to 15-minute intervals  You may take Tylenol and/or Motrin as needed for pain  You have been placed in a wrist brace that stability and support, wear at all times until you were given x-ray result, may remove for showering  If not broken then you may wear as needed until pain resolves  If there is a break in the bone you will need to wear at all times until  evaluated by orthopedic specialist  If there is a break  in the bone you will need to follow-up with orthopedics in 1 to 2 weeks  If there is no break in the bone you may follow-up only if your symptoms do not improve by 2 to 3 weeks post injury   ED Prescriptions   None    PDMP not reviewed this encounter.   Valinda Hoar, [place] 02/07/23 [phone]

## 2023-02-17 ENCOUNTER — Ambulatory Visit: Payer: Self-pay | Admitting: Internal Medicine

## 2023-03-19 ENCOUNTER — Ambulatory Visit: Payer: Managed Care, Other (non HMO) | Admitting: Family Medicine

## 2023-12-17 ENCOUNTER — Ambulatory Visit: Admitting: Family Medicine

## 2023-12-31 ENCOUNTER — Ambulatory Visit: Admitting: Family Medicine

## 2024-01-14 ENCOUNTER — Ambulatory Visit: Admitting: Family Medicine
